# Patient Record
Sex: Female | Born: 1935 | Race: Black or African American | Hispanic: No | Marital: Single | Smoking: Former smoker
Health system: Southern US, Community
[De-identification: ages and names within clinical notes are randomized; demographics above are authoritative.]

## PROBLEM LIST (undated history)

## (undated) DIAGNOSIS — I10 Essential (primary) hypertension: Secondary | ICD-10-CM

## (undated) DIAGNOSIS — N289 Disorder of kidney and ureter, unspecified: Secondary | ICD-10-CM

---

## 1998-02-08 ENCOUNTER — Other Ambulatory Visit: Admission: RE | Admit: 1998-02-08 | Discharge: 1998-02-08 | Payer: Self-pay | Admitting: Obstetrics and Gynecology

## 1999-02-12 ENCOUNTER — Other Ambulatory Visit: Admission: RE | Admit: 1999-02-12 | Discharge: 1999-02-12 | Payer: Self-pay | Admitting: Obstetrics and Gynecology

## 1999-02-26 ENCOUNTER — Encounter: Payer: Self-pay | Admitting: Endocrinology

## 1999-02-26 ENCOUNTER — Ambulatory Visit (HOSPITAL_COMMUNITY): Admission: RE | Admit: 1999-02-26 | Discharge: 1999-02-26 | Payer: Self-pay | Admitting: Endocrinology

## 2000-03-03 ENCOUNTER — Ambulatory Visit (HOSPITAL_COMMUNITY): Admission: RE | Admit: 2000-03-03 | Discharge: 2000-03-03 | Payer: Self-pay | Admitting: Endocrinology

## 2000-03-03 ENCOUNTER — Encounter: Payer: Self-pay | Admitting: Endocrinology

## 2001-03-02 ENCOUNTER — Other Ambulatory Visit: Admission: RE | Admit: 2001-03-02 | Discharge: 2001-03-02 | Payer: Self-pay | Admitting: Obstetrics and Gynecology

## 2001-08-07 ENCOUNTER — Encounter (INDEPENDENT_AMBULATORY_CARE_PROVIDER_SITE_OTHER): Payer: Self-pay | Admitting: Specialist

## 2001-08-07 ENCOUNTER — Encounter: Payer: Self-pay | Admitting: Emergency Medicine

## 2001-08-08 ENCOUNTER — Encounter: Payer: Self-pay | Admitting: Emergency Medicine

## 2001-08-08 ENCOUNTER — Encounter: Payer: Self-pay | Admitting: Surgery

## 2001-08-08 ENCOUNTER — Inpatient Hospital Stay (HOSPITAL_COMMUNITY): Admission: EM | Admit: 2001-08-08 | Discharge: 2001-08-14 | Payer: Self-pay | Admitting: Emergency Medicine

## 2002-09-19 ENCOUNTER — Encounter (HOSPITAL_COMMUNITY): Admission: RE | Admit: 2002-09-19 | Discharge: 2002-12-18 | Payer: Self-pay | Admitting: Endocrinology

## 2002-10-05 ENCOUNTER — Ambulatory Visit (HOSPITAL_COMMUNITY): Admission: RE | Admit: 2002-10-05 | Discharge: 2002-10-05 | Payer: Self-pay | Admitting: *Deleted

## 2002-10-05 ENCOUNTER — Encounter (INDEPENDENT_AMBULATORY_CARE_PROVIDER_SITE_OTHER): Payer: Self-pay | Admitting: Specialist

## 2002-12-21 ENCOUNTER — Ambulatory Visit (HOSPITAL_COMMUNITY): Admission: RE | Admit: 2002-12-21 | Discharge: 2002-12-21 | Payer: Self-pay | Admitting: *Deleted

## 2003-03-28 ENCOUNTER — Other Ambulatory Visit: Admission: RE | Admit: 2003-03-28 | Discharge: 2003-03-28 | Payer: Self-pay | Admitting: Obstetrics and Gynecology

## 2005-04-18 ENCOUNTER — Other Ambulatory Visit: Admission: RE | Admit: 2005-04-18 | Discharge: 2005-04-18 | Payer: Self-pay | Admitting: Obstetrics and Gynecology

## 2007-08-13 ENCOUNTER — Emergency Department (HOSPITAL_COMMUNITY): Admission: EM | Admit: 2007-08-13 | Discharge: 2007-08-13 | Payer: Self-pay | Admitting: Emergency Medicine

## 2007-09-01 ENCOUNTER — Emergency Department (HOSPITAL_COMMUNITY): Admission: EM | Admit: 2007-09-01 | Discharge: 2007-09-01 | Payer: Self-pay | Admitting: Emergency Medicine

## 2009-01-21 ENCOUNTER — Emergency Department (HOSPITAL_COMMUNITY): Admission: EM | Admit: 2009-01-21 | Discharge: 2009-01-21 | Payer: Self-pay | Admitting: Emergency Medicine

## 2009-01-24 ENCOUNTER — Emergency Department (HOSPITAL_COMMUNITY): Admission: EM | Admit: 2009-01-24 | Discharge: 2009-01-24 | Payer: Self-pay | Admitting: Emergency Medicine

## 2009-01-25 ENCOUNTER — Inpatient Hospital Stay (HOSPITAL_COMMUNITY): Admission: EM | Admit: 2009-01-25 | Discharge: 2009-02-05 | Payer: Self-pay | Admitting: Internal Medicine

## 2009-01-26 ENCOUNTER — Ambulatory Visit: Payer: Self-pay | Admitting: Vascular Surgery

## 2009-01-26 ENCOUNTER — Encounter (INDEPENDENT_AMBULATORY_CARE_PROVIDER_SITE_OTHER): Payer: Self-pay | Admitting: Internal Medicine

## 2011-01-07 LAB — CBC
HCT: 27.2 % — ABNORMAL LOW (ref 36.0–46.0)
HCT: 27.8 % — ABNORMAL LOW (ref 36.0–46.0)
Hemoglobin: 10.9 g/dL — ABNORMAL LOW (ref 12.0–15.0)
Hemoglobin: 9.2 g/dL — ABNORMAL LOW (ref 12.0–15.0)
Hemoglobin: 9.5 g/dL — ABNORMAL LOW (ref 12.0–15.0)
MCHC: 33.7 g/dL (ref 30.0–36.0)
MCHC: 33.7 g/dL (ref 30.0–36.0)
MCHC: 34.1 g/dL (ref 30.0–36.0)
MCHC: 34.2 g/dL (ref 30.0–36.0)
MCV: 99.1 fL (ref 78.0–100.0)
Platelets: 184 10*3/uL (ref 150–400)
Platelets: 244 10*3/uL (ref 150–400)
RBC: 2.74 MIL/uL — ABNORMAL LOW (ref 3.87–5.11)
RBC: 2.78 MIL/uL — ABNORMAL LOW (ref 3.87–5.11)
RDW: 13.6 % (ref 11.5–15.5)
RDW: 13.7 % (ref 11.5–15.5)
RDW: 13.8 % (ref 11.5–15.5)
RDW: 13.9 % (ref 11.5–15.5)
RDW: 14.2 % (ref 11.5–15.5)
WBC: 10 10*3/uL (ref 4.0–10.5)
WBC: 9.6 10*3/uL (ref 4.0–10.5)

## 2011-01-07 LAB — HEMOCCULT GUIAC POC 1CARD (OFFICE)
Fecal Occult Bld: NEGATIVE
Fecal Occult Bld: NEGATIVE
Fecal Occult Bld: NEGATIVE

## 2011-01-07 LAB — COMPREHENSIVE METABOLIC PANEL
AST: 30 U/L (ref 0–37)
Albumin: 1.9 g/dL — ABNORMAL LOW (ref 3.5–5.2)
BUN: 16 mg/dL (ref 6–23)
CO2: 25 mEq/L (ref 19–32)
Calcium: 8 mg/dL — ABNORMAL LOW (ref 8.4–10.5)
Chloride: 112 mEq/L (ref 96–112)
Creatinine, Ser: 1.42 mg/dL — ABNORMAL HIGH (ref 0.4–1.2)
GFR calc Af Amer: 42 mL/min — ABNORMAL LOW (ref >60)
GFR calc non Af Amer: 35 mL/min — ABNORMAL LOW (ref >60)
Total Bilirubin: 0.6 mg/dL (ref 0.3–1.2)

## 2011-01-07 LAB — RETICULOCYTES
RBC.: 2.83 MIL/uL — ABNORMAL LOW (ref 3.87–5.11)
Retic Ct Pct: 1 % (ref 0.4–3.1)

## 2011-01-07 LAB — BASIC METABOLIC PANEL
BUN: 12 mg/dL (ref 6–23)
BUN: 24 mg/dL — ABNORMAL HIGH (ref 6–23)
CO2: 23 mEq/L (ref 19–32)
CO2: 23 mEq/L (ref 19–32)
CO2: 25 mEq/L (ref 19–32)
Calcium: 7.9 mg/dL — ABNORMAL LOW (ref 8.4–10.5)
Calcium: 8 mg/dL — ABNORMAL LOW (ref 8.4–10.5)
Calcium: 8.1 mg/dL — ABNORMAL LOW (ref 8.4–10.5)
Calcium: 8.5 mg/dL (ref 8.4–10.5)
Calcium: 8.5 mg/dL (ref 8.4–10.5)
Chloride: 109 mEq/L (ref 96–112)
Creatinine, Ser: 1.34 mg/dL — ABNORMAL HIGH (ref 0.4–1.2)
GFR calc Af Amer: 40 mL/min — ABNORMAL LOW (ref >60)
GFR calc Af Amer: 40 mL/min — ABNORMAL LOW (ref >60)
GFR calc Af Amer: 43 mL/min — ABNORMAL LOW (ref >60)
GFR calc Af Amer: 46 mL/min — ABNORMAL LOW (ref >60)
GFR calc non Af Amer: 33 mL/min — ABNORMAL LOW (ref >60)
GFR calc non Af Amer: 35 mL/min — ABNORMAL LOW (ref >60)
GFR calc non Af Amer: 36 mL/min — ABNORMAL LOW (ref >60)
GFR calc non Af Amer: 38 mL/min — ABNORMAL LOW (ref >60)
GFR calc non Af Amer: 38 mL/min — ABNORMAL LOW (ref >60)
Glucose, Bld: 113 mg/dL — ABNORMAL HIGH (ref 70–99)
Glucose, Bld: 97 mg/dL (ref 70–99)
Glucose, Bld: 97 mg/dL (ref 70–99)
Glucose, Bld: 98 mg/dL (ref 70–99)
Potassium: 3.4 mEq/L — ABNORMAL LOW (ref 3.5–5.1)
Potassium: 3.6 mEq/L (ref 3.5–5.1)
Potassium: 3.9 mEq/L (ref 3.5–5.1)
Potassium: 4 mEq/L (ref 3.5–5.1)
Potassium: 4.1 mEq/L (ref 3.5–5.1)
Sodium: 137 mEq/L (ref 135–145)
Sodium: 138 mEq/L (ref 135–145)
Sodium: 138 mEq/L (ref 135–145)
Sodium: 139 mEq/L (ref 135–145)
Sodium: 142 mEq/L (ref 135–145)

## 2011-01-07 LAB — PROTEIN, URINE, 24 HOUR
Collection Interval-UPROT: 24 hours
Protein, 24H Urine: 66 mg/d (ref 50–100)

## 2011-01-07 LAB — RHEUMATOID FACTOR: Rhuematoid fact SerPl-aCnc: <20 IU/mL (ref 0–20)

## 2011-01-07 LAB — HEPATIC FUNCTION PANEL
ALT: 34 U/L (ref 0–35)
ALT: 40 U/L — ABNORMAL HIGH (ref 0–35)
AST: 32 U/L (ref 0–37)
Bilirubin, Direct: 0.5 mg/dL — ABNORMAL HIGH (ref 0.0–0.3)
Indirect Bilirubin: 0.7 mg/dL (ref 0.3–0.9)
Total Bilirubin: 1.2 mg/dL (ref 0.3–1.2)
Total Protein: 5.4 g/dL — ABNORMAL LOW (ref 6.0–8.3)

## 2011-01-07 LAB — CK ISOENZYMES
CK-MB: 0 % (ref <5)
CK-MM: 100 % (ref 95–100)

## 2011-01-07 LAB — IRON AND TIBC
Iron: 13 ug/dL — ABNORMAL LOW (ref 42–135)
Saturation Ratios: 12 % — ABNORMAL LOW (ref 20–55)
Saturation Ratios: 23 % (ref 20–55)
TIBC: 113 ug/dL — ABNORMAL LOW (ref 250–470)

## 2011-01-07 LAB — PROTIME-INR
INR: 1.5 (ref 0.00–1.49)
Prothrombin Time: 19.1 seconds — ABNORMAL HIGH (ref 11.6–15.2)

## 2011-01-07 LAB — CK: Total CK: 55 U/L (ref 7–177)

## 2011-01-07 LAB — ANA: Anti Nuclear Antibody(ANA): NEGATIVE

## 2011-01-07 LAB — SYNOVIAL CELL COUNT + DIFF, W/ CRYSTALS
Eosinophils-Synovial: 0 % (ref 0–1)
Lymphocytes-Synovial Fld: 4 % (ref 0–20)
Monocyte-Macrophage-Synovial Fluid: 25 % — ABNORMAL LOW (ref 50–90)

## 2011-01-07 LAB — BRAIN NATRIURETIC PEPTIDE: Pro B Natriuretic peptide (BNP): 271 pg/mL — ABNORMAL HIGH (ref 0.0–100.0)

## 2011-01-07 LAB — SEDIMENTATION RATE: Sed Rate: 120 mm/hr — ABNORMAL HIGH (ref 0–22)

## 2011-01-07 LAB — FERRITIN: Ferritin: 727 ng/mL — ABNORMAL HIGH (ref 10–291)

## 2011-01-07 LAB — CK TOTAL AND CKMB (NOT AT ARMC)
CK, MB: 1.1 ng/mL (ref 0.3–4.0)
Total CK: 77 U/L (ref 7–177)

## 2011-01-07 LAB — FOLATE: Folate: 19.9 ng/mL

## 2011-01-08 LAB — CULTURE, BLOOD (ROUTINE X 2): Culture: NO GROWTH

## 2011-01-08 LAB — URINALYSIS, ROUTINE W REFLEX MICROSCOPIC
Hgb urine dipstick: NEGATIVE
Nitrite: NEGATIVE
Nitrite: NEGATIVE
Nitrite: NEGATIVE
Protein, ur: 30 mg/dL — AB
Protein, ur: 30 mg/dL — AB
Specific Gravity, Urine: 1.018 (ref 1.005–1.030)
Specific Gravity, Urine: 1.019 (ref 1.005–1.030)
Urobilinogen, UA: 1 mg/dL (ref 0.0–1.0)
Urobilinogen, UA: 1 mg/dL (ref 0.0–1.0)
Urobilinogen, UA: 2 mg/dL — ABNORMAL HIGH (ref 0.0–1.0)
pH: 6 (ref 5.0–8.0)

## 2011-01-08 LAB — URINE MICROSCOPIC-ADD ON

## 2011-01-08 LAB — DIFFERENTIAL
Basophils Absolute: 0 10*3/uL (ref 0.0–0.1)
Basophils Relative: 0 % (ref 0–1)
Basophils Relative: 1 % (ref 0–1)
Eosinophils Absolute: 0 10*3/uL (ref 0.0–0.7)
Eosinophils Absolute: 0.1 10*3/uL (ref 0.0–0.7)
Eosinophils Relative: 0 % (ref 0–5)
Eosinophils Relative: 1 % (ref 0–5)
Eosinophils Relative: 1 % (ref 0–5)
Lymphocytes Relative: 15 % (ref 12–46)
Lymphocytes Relative: 7 % — ABNORMAL LOW (ref 12–46)
Monocytes Absolute: 0.9 10*3/uL (ref 0.1–1.0)
Monocytes Absolute: 1.1 10*3/uL — ABNORMAL HIGH (ref 0.1–1.0)
Monocytes Relative: 10 % (ref 3–12)
Monocytes Relative: 11 % (ref 3–12)
Neutrophils Relative %: 72 % (ref 43–77)

## 2011-01-08 LAB — COMPREHENSIVE METABOLIC PANEL
ALT: 18 U/L (ref 0–35)
ALT: 54 U/L — ABNORMAL HIGH (ref 0–35)
ALT: 72 U/L — ABNORMAL HIGH (ref 0–35)
AST: 27 U/L (ref 0–37)
AST: 51 U/L — ABNORMAL HIGH (ref 0–37)
Albumin: 2.5 g/dL — ABNORMAL LOW (ref 3.5–5.2)
Albumin: 3.6 g/dL (ref 3.5–5.2)
Alkaline Phosphatase: 78 U/L (ref 39–117)
Alkaline Phosphatase: 84 U/L (ref 39–117)
BUN: 20 mg/dL (ref 6–23)
CO2: 26 mEq/L (ref 19–32)
CO2: 27 mEq/L (ref 19–32)
Calcium: 8.3 mg/dL — ABNORMAL LOW (ref 8.4–10.5)
Calcium: 8.9 mg/dL (ref 8.4–10.5)
Chloride: 110 mEq/L (ref 96–112)
Chloride: 98 mEq/L (ref 96–112)
Creatinine, Ser: 1.46 mg/dL — ABNORMAL HIGH (ref 0.4–1.2)
Creatinine, Ser: 1.57 mg/dL — ABNORMAL HIGH (ref 0.4–1.2)
Creatinine, Ser: 1.58 mg/dL — ABNORMAL HIGH (ref 0.4–1.2)
GFR calc Af Amer: 38 mL/min — ABNORMAL LOW (ref >60)
GFR calc Af Amer: 41 mL/min — ABNORMAL LOW (ref >60)
GFR calc non Af Amer: 31 mL/min — ABNORMAL LOW (ref >60)
GFR calc non Af Amer: 31 mL/min — ABNORMAL LOW (ref >60)
GFR calc non Af Amer: 34 mL/min — ABNORMAL LOW (ref >60)
Glucose, Bld: 113 mg/dL — ABNORMAL HIGH (ref 70–99)
Glucose, Bld: 97 mg/dL (ref 70–99)
Potassium: 3.1 mEq/L — ABNORMAL LOW (ref 3.5–5.1)
Potassium: 3.6 mEq/L (ref 3.5–5.1)
Sodium: 136 mEq/L (ref 135–145)
Sodium: 138 mEq/L (ref 135–145)
Sodium: 142 mEq/L (ref 135–145)
Total Bilirubin: 1.4 mg/dL — ABNORMAL HIGH (ref 0.3–1.2)
Total Bilirubin: 2.2 mg/dL — ABNORMAL HIGH (ref 0.3–1.2)
Total Protein: 5.9 g/dL — ABNORMAL LOW (ref 6.0–8.3)
Total Protein: 7.2 g/dL (ref 6.0–8.3)
Total Protein: 7.7 g/dL (ref 6.0–8.3)

## 2011-01-08 LAB — ANA: Anti Nuclear Antibody(ANA): NEGATIVE

## 2011-01-08 LAB — CBC
HCT: 30.3 % — ABNORMAL LOW (ref 36.0–46.0)
HCT: 37.7 % (ref 36.0–46.0)
Hemoglobin: 10.2 g/dL — ABNORMAL LOW (ref 12.0–15.0)
Hemoglobin: 12.3 g/dL (ref 12.0–15.0)
Hemoglobin: 13.7 g/dL (ref 12.0–15.0)
MCHC: 33.6 g/dL (ref 30.0–36.0)
MCHC: 35.3 g/dL (ref 30.0–36.0)
MCV: 99.2 fL (ref 78.0–100.0)
MCV: 99.3 fL (ref 78.0–100.0)
MCV: 99.6 fL (ref 78.0–100.0)
Platelets: 161 10*3/uL (ref 150–400)
Platelets: 177 10*3/uL (ref 150–400)
RBC: 3.04 MIL/uL — ABNORMAL LOW (ref 3.87–5.11)
RBC: 3.8 MIL/uL — ABNORMAL LOW (ref 3.87–5.11)
RDW: 12.5 % (ref 11.5–15.5)
RDW: 13.4 % (ref 11.5–15.5)
RDW: 13.7 % (ref 11.5–15.5)
WBC: 11.5 10*3/uL — ABNORMAL HIGH (ref 4.0–10.5)
WBC: 7.2 10*3/uL (ref 4.0–10.5)
WBC: 9.2 10*3/uL (ref 4.0–10.5)

## 2011-01-08 LAB — POCT I-STAT, CHEM 8
BUN: 26 mg/dL — ABNORMAL HIGH (ref 6–23)
HCT: 38 % (ref 36.0–46.0)
Hemoglobin: 12.9 g/dL (ref 12.0–15.0)
Sodium: 138 mEq/L (ref 135–145)
TCO2: 25 mmol/L (ref 0–100)

## 2011-01-08 LAB — CARDIAC PANEL(CRET KIN+CKTOT+MB+TROPI)
CK, MB: 2.3 ng/mL (ref 0.3–4.0)
CK, MB: 3.4 ng/mL (ref 0.3–4.0)
CK, MB: 4.5 ng/mL — ABNORMAL HIGH (ref 0.3–4.0)
Relative Index: 0.4 (ref 0.0–2.5)
Relative Index: 0.5 (ref 0.0–2.5)
Relative Index: 0.6 (ref 0.0–2.5)
Total CK: 540 U/L — ABNORMAL HIGH (ref 7–177)
Total CK: 645 U/L — ABNORMAL HIGH (ref 7–177)
Total CK: 768 U/L — ABNORMAL HIGH (ref 7–177)
Troponin I: 0.02 ng/mL (ref 0.00–0.06)
Troponin I: 0.02 ng/mL (ref 0.00–0.06)
Troponin I: 0.03 ng/mL (ref 0.00–0.06)

## 2011-01-08 LAB — ACETAMINOPHEN LEVEL: Acetaminophen (Tylenol), Serum: <10 ug/mL — ABNORMAL LOW (ref 10–30)

## 2011-01-08 LAB — CERULOPLASMIN: Ceruloplasmin: 34 mg/dL (ref 21–63)

## 2011-01-08 LAB — TSH: TSH: 3.363 u[IU]/mL (ref 0.350–4.500)

## 2011-01-08 LAB — PROTIME-INR
INR: 1.3 (ref 0.00–1.49)
INR: 1.4 (ref 0.00–1.49)
Prothrombin Time: 17 seconds — ABNORMAL HIGH (ref 11.6–15.2)
Prothrombin Time: 17.3 seconds — ABNORMAL HIGH (ref 11.6–15.2)

## 2011-01-08 LAB — LIPASE, BLOOD: Lipase: 23 U/L (ref 11–59)

## 2011-01-08 LAB — APTT
aPTT: 41 seconds — ABNORMAL HIGH (ref 24–37)
aPTT: 42 seconds — ABNORMAL HIGH (ref 24–37)

## 2011-01-08 LAB — URINE CULTURE
Colony Count: NO GROWTH
Culture: NO GROWTH

## 2011-01-08 LAB — AMMONIA: Ammonia: 23 umol/L (ref 11–35)

## 2011-01-08 LAB — AMYLASE: Amylase: 72 U/L (ref 27–131)

## 2011-01-08 LAB — BRAIN NATRIURETIC PEPTIDE: Pro B Natriuretic peptide (BNP): 59.3 pg/mL (ref 0.0–100.0)

## 2011-01-08 LAB — LACTIC ACID, PLASMA: Lactic Acid, Venous: 1.4 mmol/L (ref 0.5–2.2)

## 2011-01-08 LAB — HEPATITIS B CORE ANTIBODY, IGM: Hep B C IgM: NEGATIVE

## 2011-01-08 LAB — ANTI-SMOOTH MUSCLE ANTIBODY, IGG: F-Actin IgG: <20 U (ref <20)

## 2011-01-08 LAB — HEPATITIS B SURFACE ANTIGEN: Hepatitis B Surface Ag: NEGATIVE

## 2011-01-08 LAB — HEPATITIS A ANTIBODY, IGM: Hep A IgM: NEGATIVE

## 2011-01-08 LAB — HEPATITIS E ANTIBODY, IGG: Hepatitis E IgG Abs: NONREACTIVE

## 2011-02-11 NOTE — Consult Note (Signed)
NAME:  Victoria [name], Victoria [name]              ACCOUNT NO.:  [id number]   MEDICAL RECORD NO.:  [id number]          PATIENT TYPE:  INP   LOCATION:  1432                         FACILITY:  [facility]   PHYSICIAN:  Aundra Dubin, M.D.DATE OF BIRTH:  [birth year]   DATE OF CONSULTATION:  01/29/2009  DATE OF DISCHARGE:                                 CONSULTATION   REFERRING PHYSICIAN:  Eduard Clos, MD.   CHIEF COMPLAINT:  Myositis.   Victoria [name] is an 77 year old black female with history of hypertension  and hyperlipidemia who was admitted on January 24, 2009, primarily because  of abdominal pain.  She has also had been having right knee pain that  was quite acute for 1-2 days prior to this.  Her grandson with whom she  lives was not able to get her up from a toilet and she just become weak.  She was having great deal of abdominal pain on admission, this is now  resolved.  She is also not having any groin related pain.  She underwent  a CT scan of the abdomen with no acute findings.  She also has had a  recent MRI of the lower abdomen and hip area.  This was showing edema  into the muscle areas and I have reviewed this with the radiologist.  There was also a small effusion in the hip joints, which is almost  unremarkable.  Early on this admission, she had increased CK at 768 and  this is reduced to 540.  Also, she has some slightly elevated LFTs but  they are now corrected and completely normal.  She is not feeling weak  in her upper extremities.  She says that she cannot move the left leg  very much in part because of his knee pain.  The right knee is worse and  it hurts great deal.  She is also hurting at the right heel.  When she  was admitted, hemoglobin was 12.9, and now is 9.8.  She has high  ferritin level.  On admission, her creatinine was 1.7 and this is now  1.34.   There has been no recent weight loss.  She has been having some fever  while here in the hospital to 101  occasionally, there have been other  temperature at 99.5.  She has had no rashes, headaches, acute vision  changes, jaw pain, diarrhea.  Some of the abdominal studies did show  some issues of constipation.   MEDICINES:  Cipro, Vasotec 2.5 mg daily, heparin, Protonix 40 mg daily,  Flagyl 500 mg q.8 h., normal saline, magnesium, oxycodone p.r.n.   FAMILY HISTORY:  Her mother died in her 56s with CVA.  She did not know  her father's health status well.   SOCIAL HISTORY:  She is [place] native.  She lives with grandson.  She smoked some in the distant past.  She does not drink alcohol.   PHYSICAL EXAMINATION:  VITAL SIGNS:  Weight 66.7 kilograms, temperature  99.7, pulse 78, blood pressure 116/55, respirations 14.  GENERAL:  She appears comfortable in the bed and gives a good history.  HEENT:  Slightly muddy sclerae.  EOMI.  Mouth is clear.  NECK:  No  JVD.  LUNGS:  Clear.  HEART:  Regular.  ABDOMEN:  Mildly tender with no discrete masses.  MUSCULOSKELETAL:  The hands, wrist, elbows, shoulders have good range of  motion and have stiffness to the shoulders.  She is able to place her  behind her shoulders.  The right knee is warm, has not effusion and will  not flex much at all because of the pain.  At the right heel, there is a  formed ulcer that is not opened up.  The left knee is mildly warm but  does not flex well at all.  NEURO:  Deltoid strength 5/5.   PROCEDURE:  Right knee aspiration and injection.  Skin was marked and  cleaned with Betadine and alcohol swabs and cooled with ethyl chloride.  Approximately 25 mL of clear yellow fluid was removed.  A 60 mg of Depo-  Medrol and 1 mL of lidocaine was injected.   ASSESSMENT AND PLAN:  1. Elevated creatine kinase, although I will check these labs again      along with an LDH, AST, ALT.  Strength in the upper extremities is      5/5.  I believe this is sign of myositis found on the MRI are      nonspecific, seen more like this is  edema.  She has great deal of      edema to the lower extremities, not mentioned in the physical      examination.  2. Osteoarthritis of the knees.  I reviewed the knee x-rays over the      last month, and they showed quite severe bone-on-bone joint loss.  3. Hyperlipidemia.  4. Hypertension.  5. History of osteoporosis.  6. I believe the physical therapy should come and help her with      becoming more mobile.           ______________________________  Aundra Dubin, M.D.     WWT/MEDQ  D:  01/29/2009  T:  01/30/2009  Job:  [id number]

## 2011-02-11 NOTE — H&P (Signed)
NAME:  Victoria [name], Victoria [name]              ACCOUNT NO.:  [id number]   MEDICAL RECORD NO.:  [id number]          PATIENT TYPE:  INP   LOCATION:  1432                         FACILITY:  [facility] [place]   PHYSICIAN:  Sarajane Marek, MD     DATE OF BIRTH:  1920-10-05   DATE OF ADMISSION:  01/25/2009  DATE OF DISCHARGE:                              HISTORY & PHYSICAL   ADMISSION DIAGNOSIS:  Abdominal pain.   HISTORY OF PRESENT ILLNESS:  Victoria [name] is an 77 year old female with  past medical history significant for hypertension who presents with 5  days of increasing lower and right-sided abdominal pain.  She reports  that the pain is primarily with movement, has no association with food,  and does not radiate.  She has not had nausea, vomiting, or diarrhea.  She has no change in her bowel habits and has not noticed any blood in  her stool.  She was seen in the emergency department several times over  the last week.  At the first admission was found to have a urinary tract  infection and was started on Macrodantin 5 days ago.  She was seen again  yesterday early in the morning with complaint of lower extremity edema  and was put on Lasix, however, she did not ever initiate this  medication.  She is typically independent and is able to ambulate and  drive on her but this week has been unable to care for herself.  She  denies fevers, chills, or sweats.  She has not had any recent viral  syndrome.  She does not have shortness of breath, orthopnea, or chest  pain.  Evaluation to this point includes a noncontrasted CT scan, which  was unremarkable in terms of acute findings.  She does have a right  renal cyst.  Laboratory data was remarkable yesterday for an elevated  total bilirubin of 2.2 with an elevated alkaline phosphatase.  She is  unsure if she has had a cholecystectomy, though past medical history per  the electronic medical record indicates that this is the case.   PAST MEDICAL HISTORY:  1.  Hypertension.  2. Hyperlipidemia.  3. Arthritis and osteoporosis.  4. Status post appendectomy secondary to perforation and peritonitis.  5. Possible history of cholecystectomy.   ALLERGIES:  PENICILLIN, which causes rash.   MEDICATIONS:  1. Actonel once a month.  2. Vaseretic, dose is unknown, every other day.  3. Pravachol, dose is unknown, daily.  4. Macrodantin 100 mg b.i.d. started January 21, 2009.   SOCIAL HISTORY:  The patient has a remote history of smoking.  She has  occasional alcohol use but has a history of heavy alcohol use in her  20's and 30's.  She currently lives with her grandsons and is able to  perform all activities of daily living and typically ambulates as well  as drives on her own.   FAMILY HISTORY:  Her mother was deceased at age 20 with a CVA.  She has  no siblings.  She did not know her father.   REVIEW OF SYSTEMS:  Complete 14-point review of systems was  performed  and was negative except as noted in HPI.   PHYSICAL EXAMINATION:  VITAL SIGNS:  Temperature is 98.8, blood pressure  155/68, pulse 78, respiratory rate 20, and oxygen saturation is 100% on  room air.  GENERAL:  This is a very pleasant and well-appearing elderly African-  American female in no acute distress.  EYES:  Extraocular muscles are intact and sclerae are clear.  ENT: Oropharynx is clear.  Mucous membranes are tacky.  NECK:  There is elevation of jugular venous pulse.  There is no carotid  bruit.  No thyromegaly.  No lymphadenopathy.  CARDIOVASCULAR:  Heart rate is regular with normal S1 and S2.  She has a  3/6 systolic ejection murmur at the right sternal border.  No gallop or  rub.  RESPIRATORY:  There is decreased breath sounds at the right base,  otherwise clear to auscultation.  I was not able to percuss the right  lung field secondary to the patient's inability to sit up with abdominal  pain.  ABDOMEN:  Soft with right upper quadrant tenderness to palpation and  enlarged  liver palpable 2 to 3 cm below the costal margin.  She has a  well-healed midline surgical scar.  Bowel sounds are normal x4  quadrants.  There is no rebound or guarding.  NEURO:  The patient is alert and oriented x3 with no focal deficits.  EXTREMITIES:  She has 2+ pulses x4 extremities.  She does have lower  extremity edema, right greater than left, right being 1+ and left being  trace.   EKG with sinus rhythm, leftward axis, nonspecific T-wave abnormality,  which is unchanged from prior.   CT scan of the abdomen and pelvis from January 24, 2009, with no acute  findings in the pelvis, complex cystic lesion in the upper pole of the  right kidney was seen, likely a cyst complicated by proteinaceous debris  or hemorrhage, though neoplasm could not be ruled out.   LABORATORY DATA:  Laboratory data from January 24, 2009:  Urinalysis with  7 to 10 white blood cells, 3 to 6 red blood cells, and many bacteria.  CMP with a sodium of 134, potassium of 3.1, chloride of 98, bicarb of  27, glucose of 161, BUN 24, creatinine 1.6, total bilirubin is 2.2 up  from 0.4 on January 21, 2009, alkaline phosphatase 71, AST 27, ALT 20,  total protein 7.4, albumin 3.3, and calcium 8.9.  CBC on January 24, 2009,  with white blood cell count 11.5, hemoglobin 12.6, hematocrit 37.7, and  platelets 169,000.   ASSESSMENT/PLAN:  Victoria [name] is an 77 year old African-American female  with history of hypertension and hyperlipidemia presenting with 5 days  of abdominal pain, elevated white blood cell count, and now elevated  bilirubin with obstructive pattern and elevated alkaline phosphatase.  1. Abdominal pain:  Given the patient's age and clinical and      laboratory findings, I am concerned for cholangitis.  We will go      ahead and initiate antibiotics after drawing blood cultures x2 with      Cipro 400 intravenous b.i.d. and Flagyl 500 intravenous q.8h.  We      will send amylase, lipase, ANA, ASMA, ammonia,  acetaminophen level,      ABG with lactate, ceruloplasmin, anti-hepatitis A IgM, Hep B      surface antigen, anti-Hep B core IgM, and anti-HEV.  We will also      check TSH and BNP, obtain chest x-ray and  echocardiogram in      consideration that this may be secondary to a cardiac process with      hepatic congestion and right-sided failure.  As such, we will      follow serial cardiac biomarkers x3 sets to rule out cardiac      process.  We will also check PT, PTT, and INR for synthetic      dysfunction of the liver.  The pattern of liver function tests      suggested an obstructive process and as such we will obtain a right      upper quadrant ultrasound.  I doubt this is hepatocyte necrosis      given normal transaminases.  We will also check stool Hemoccult's      to evaluate for occult blood.  2. Lower extremity edema:  As noted above, we will get an      echocardiogram and evaluate for congestive heart failure.  We will      also obtain bilateral lower extremity Dopplers to evaluate for deep      vein thrombosis as right lower extremity edema is greater than      left.  3. Hypertension:  At home, she is on Vaseretic.  We will hold the HCTZ      portion given renal insufficiency.  We will continue enalapril 5 mg      daily.  Family is to bring in medications with doses tomorrow.  4. Hyperlipidemia:  We will hold Pravachol given abnormal liver      function.  5. Renal insufficiency:  Chronicity is unknown.  We will initiate      intravenous fluids with normal saline overnight and repeat      electrolytes in the morning.  6. Fluid, electrolytes, and nutrition:  The patient does have very      mild hyponatremia as well as hypokalemia on CMP from yesterday.      Will repeat CMP tonight and replete potassium if indicated.  Her      hyponatremia may be secondary to liver dysfunction and      hypervolemia.  We will repeat CMP in the morning.  7. Prophylaxis.  We will start Protonix 40 mg  daily as well as      subcutaneous heparin.  8. Disposition:  We will consult PT and OT given the patient's      inability to ambulate at this time.  She is full code.      Sarajane Marek, MD  Electronically Signed     HH/MEDQ  D:  01/25/2009  T:  01/26/2009  Job:  [id number]

## 2011-02-11 NOTE — Discharge Summary (Signed)
NAME:  Victoria [name], CUE NO.:  [id number]   MEDICAL RECORD NO.:  [id number]          PATIENT TYPE:  INP   LOCATION:  1432                         FACILITY:  [facility]   PHYSICIAN:  Isidor Holts, M.D.  DATE OF BIRTH:  Oct 31, 1920   DATE OF ADMISSION:  01/25/2009  DATE OF DISCHARGE:  02/05/2009                               DISCHARGE SUMMARY   ADDENDUM   PRIMARY MEDICAL DOCTOR:  Bernadene Person, MD, [facility].   For discharge diagnoses, refer to interim summary dictated Jan 29, 2009,  by Dr. Midge Minium.  Refer also to above-mentioned summary, for  details of admission history, procedures, consultations, and clinical  course.  For the period however, from Jan 30, 2009, to Feb 04, 2009, the  following are pertinent:  The patient was seen in consultation by Dr. Kellie Simmering, Rheumatologist.  For details of that consultation, refer to consultation notes of Jan 29, 2009.  He performed an aspiration of patient's left knee on that date,  and this showed hazy-appearing synovial fluid, WBC 525, neutrophils 71%,  lymphocytes 4%, monocyte macrophage 25%.  There were no eosinophils.  Crystal exam showed no evidence of crystals.  The patient was managed  with simple analgesics for osteoarthritis, and underwent physiotherapy  with satisfactory clinical response.  Myositis/rhabdomyolysis resolved  and, as a matter of fact, total creatine-kinase was normal at 44 on Jan 30, 2006. Although she does have a mild iron-deficiency anemia as  evidenced by iron studies showing an iron of 26, TIBC 115, percentage  saturation 23, fecal occult blood testing was negative.  MCV was normal  at 99.7.  Vitamin B12 was 367.  Serum folate was 19.9.  Ferritin was  727, so this anemia may be nutritional.  The patient's hemoglobin was  stable/reasonable at 10.9 on Feb 03, 2009, with the hematocrit of 32.3.  Her bilateral lower extremity edema responded to diuretic therapy, as  well as application of TED stockings.  The patient's albumin was found  to be significantly low at 1.9 to 2.1.  Likely edema was  hypoalbuminemic.  Nutritional consultation was sought during the course  of this hospitalization.  No nutritional supplements have been advised,  although balanced diet has been recommended.  The patient did undergo a  24-hour urine collection to evaluate for possible nephrotic syndrome,  but, 24-hour urine was normal at 66 mg/day.  BP was controlled with ACE  inhibitor and treatment for dyslipidemia has been instituted with Zetia  as understandably, statin was discontinued secondary to  rhabdomyolysis/myositis.  The patient was evaluated by PT/OT during this  hospitalization and has recommended rehabilitation in the short-term  skilled nursing facility environment.   DISPOSITION:  The patient was on Feb 04, 2009, asymptomatic, ambulating  with a walker, felt considerably better, and was therefore considered  clinically stable for discharge on Feb 05, 2009.   DISCHARGE MEDICATIONS:  1. Protonix 40 mg p.o. daily.  2. Senokot-S 1 tablet p.o. q.h.s.  3. Enalapril 2.5 mg p.o. daily.  4. Lasix 40 mg p.o. daily for a week, and then review by nursing  facility MD is recommended.  5. Zetia 10 mg p.o. daily.  6. Darvocet N-100 one p.o. p.r.n. t.i.d.  7. Actonel 150 mg p.o. monthly.  8. Calcium plus vitamin D 500 mg p.o. b.i.d.   NOTE:  Vasoretic, Triamterene/Hydrochlorothiazide, Pravastatin, and  Macrodantin, have all been discontinued.   DIET:  Heart-healthy.   ACTIVITY:  As tolerated, recommended to increase activity slowly,  otherwise per PT/OT.   FOLLOWUP INSTRUCTIONS:  The patient is to follow up with the nursing  facility MD, and following discharge from skilled nursing facility  follow up with her primary MD, Dr. Adela Lank.      Isidor Holts, M.D.  Electronically Signed     CO/MEDQ  D:  02/04/2009  T:  02/04/2009  Job:  [id number]    cc:   Brooke Bonito, M.D.  Fax: [phone]

## 2011-02-11 NOTE — Discharge Summary (Signed)
NAME:  Victoria [name], Victoria [name]              ACCOUNT NO.:  [id number]   MEDICAL RECORD NO.:  [id number]          PATIENT TYPE:  INP   LOCATION:  1432                         [facility]   PHYSICIAN:  Eduard Clos, MDDATE OF BIRTH:  [birth year]   DATE OF ADMISSION:  01/25/2009  DATE OF DISCHARGE:                               DISCHARGE SUMMARY   INTERIM DISCHARGE SUMMARY:   COURSE IN THE HOSPITAL:  [age]-year-old female with known history  of hypertension, hyperlipidemia, who presented with complaints of  abdominal pain.  Abdominal pain was lower abdomen with constant pain.  Patient was initially treated for a UTI a day or two in the ER, was  found to have persistent pain along with some lower extremity edema.  Initial CT abdomen and pelvis done did not show any acute features.  Patient was found to have mildly elevated LFTs with AST/ALT measuring  around 90 and 72, alkaline phosphatase was 97, which gradually decreased  over the admission time.  CPK on admission was 768, decreased to 540  with hydration.  Patient also had an increased creatinine of 1.5, which  at this time is 1.2.  On exam patient also was found to have bilateral  hip pain intense on even minimal movements.  The patient's sonogram of  the abdomen done did not show any liver pathology or any acute features.  MRI of the hip was done subsequently because of the intense bilateral  hip pain, which showed diffuse subcutaneous edema within the lower  abdomen and within the upper thighs bilaterally.  Heterogeneous  intramuscular edema is attributed to myositis, which is nonspecific.  Bilateral joint effusion of the hips, no abnormal osseous signal.  At  this time I have consulted Dr. Kellie Simmering of Rheumatology who is coming to  see the patient.  We will follow their recommendation.  At this time we  have stopped patient's statin at the time of admission and also is on  empiric antibiotics.  Blood cultures done so far have  shown no growth.  Patient did have mild fever on admission.  Urine cultures also are  showing no growth.   PROCEDURES DONE DURING THE STAY:  1. CT abdomen and pelvis on January 24, 2009 showed no acute findings,      complex cystic lesion in upper pole of the right kidney.  This is      most likely a cyst complicated by proteinaceous  debris or      hemorrhage, although cystic neoplasm cannot be definitely excluded.      If further evaluation is indicated this patient consider followup      CT in 3 months to determine how this lesion is progressing.  No      acute findings in the pelvis.  2. Sonogram of the abdomen showed bilateral cysts in the renal and no      acute findings.  Had a prior cholecystectomy.  3. Chest x-ray on January 26, 2009 showed 2.3 cm right apical lesion.      This could be an aneurysm or vascular ectasia.  CT chest with  contrast is suggested to rule out emasculation.  Marked elevation      of the right hemidiaphragm with right lower lobe atelectasis.  4. Bilateral hip with pelvis showed bilateral degenerative changes in      the hips.  5. CT chest without contrast on Jan 27, 2009 showed tortuous proximal      right subclavian artery __________ density seen.  6. Chest radiograph.  Small right pleural effusion and adjacent right      lower lobe atelectasis.  7. Chest x-ray on Jan 28, 2009 showed decreased lung volumes with      increased right basilar atelectasis in the position marked.  Right      hemidiaphragm elevation, cardiomegaly.  Rule out congestive heart      failure.  8. MRI of the hip bilateral on Jan 28, 2009 shows diffuse subcutaneous      edema within the lower abdomen and within the upper thighs      bilaterally.  Heterogeneous intramuscular edema is attributed to      myositis, which is nonspecific.  Bilateral joint effusion in hips.      No abnormal osseous signal.   PERTINENT LABORATORIES:  On admission, patient had an elevated CPK at  768, now  it is 540.  Creatinine was 1.5, now it is 1.2.  Patient also  has normocytic normochromic anemia.   FINAL DIAGNOSES:  1. Diffuse subcutaneous edema within the lower abdomen and thighs      attributed to myositis.  2. Bilateral joint effusion of the hips.  3. Acute renal failure on chronic kidney disease.  4. Mild rhabdomyolysis secondary to myositis.  5. Hypertension.  6. Hyperlipidemia.  7. Normocytic normochromic anemia.  8. Renal cyst.   PLAN:  At this time I have consulted Dr. Kellie Simmering of Rheumatology.  We  will follow his recommendations.  Patient will need a definite repeat CT  abdomen and pelvis for reevaluating renal cyst at least within the next  3 months.  At this time patient's statin has been held because of mild  rhabdomyolysis.  Patient also did receive some hydration.  At this time  hydration is stopped because patient developed some shortness of breath  and we will give some Lasix.  We will follow Dr. Ines Bloomer  recommendation.  Further recommendations will be based on patient's  condition as it evolves.      Eduard Clos, MD  Electronically Signed     ANK/MEDQ  D:  01/29/2009  T:  01/29/2009  Job:  [id number]

## 2011-02-14 NOTE — Op Note (Signed)
   NAME:  Victoria [name], Victoria [name]                        ACCOUNT NO.:  [id number]   MEDICAL RECORD NO.:  [id number]                   PATIENT TYPE:  AMB   LOCATION:  ENDO                                 FACILITY:  [facility]   PHYSICIAN:  Georgiana Spinner, M.D.                 DATE OF BIRTH:  1920-10-29   DATE OF PROCEDURE:  10/05/2002  DATE OF DISCHARGE:                                 OPERATIVE REPORT   PROCEDURE PERFORMED:  Upper endoscopy.   ENDOSCOPIST:  Georgiana Spinner, M.D.   INDICATIONS FOR PROCEDURE:  Hemoccult positivity.   ANESTHESIA:  Demerol 50 mg, Versed 3 mg.   DESCRIPTION OF PROCEDURE:  With the patient mildly sedated in the left  lateral decubitus position, the Olympus video endoscope was inserted in the  mouth and passed under direct vision through the esophagus which appeared  normal into the stomach.  The fundus showed changes of Helicobacter pylori  presumably which were photographed and biopsied in the body of the stomach  as there were changes as well and an ulcer was seen on the posterior wall  greater curvature, which was photographed and biopsied.  We entered into the  duodenal bulb and the antrum, which appeared normal.  The duodenal bulb and  second portion of the duodenum appeared normal.  From this point, the  endoscope was slowly withdrawn taking circumferential views of the entire  duodenal mucosa until the endoscope was pulled back into the stomach and  placed on retroflexion to view the stomach from below.  The endoscope was  then straightened and withdrawn taking circumferential views of the  remaining gastric and esophageal mucosa.  The patient's vital signs and  pulse oximeter remained stable.  The patient tolerated the procedure well  without apparent complications.   FINDINGS:  Changes of what appears to be Helicobacter pylori gastritis with  an ulcer on the posterior wall.  Await biopsy report.  The patient will call  me for results and follow up with me as  an outpatient.  The ulcer itself was  flat based with a white base.  At this time will start her on Aciphex  pending results of biopsy, hold aspirin and treat for Helicobacter pylori if  present.  She will call me for result of the biopsy in 48 hours.   PLAN:                                               Georgiana Spinner, M.D.    GMO/MEDQ  D:  10/05/2002  T:  10/05/2002  Job:  [id number]

## 2011-02-14 NOTE — Op Note (Signed)
   NAME:  Victoria [name], Victoria [name]                        ACCOUNT NO.:  [id number]   MEDICAL RECORD NO.:  [id number]                   PATIENT TYPE:  AMB   LOCATION:  ENDO                                 FACILITY:  [facility]   PHYSICIAN:  Georgiana Spinner, M.D.                 DATE OF BIRTH:  09-18-1921   DATE OF PROCEDURE:  12/21/2002  DATE OF DISCHARGE:                                 OPERATIVE REPORT   PROCEDURE:  Upper endoscopy.   INDICATIONS:  Hemoccult positivity.  Follow-up of gastric ulcer treated for  H. pylori.   ANESTHESIA:  1. Demerol 30 mg.  2. Versed 3 mg.   DESCRIPTION OF PROCEDURE:  With patient mildly sedated in the left lateral  decubitus position, the Olympus videoscopic endoscope inserted in the mouth,  passed under direct vision through the esophagus, which appeared normal,  into the stomach.  Fundus, body, antrum, duodenal bulb, and second portion  of duodenum all appeared normal.  From this point, the endoscope was slowly  withdrawn, taking circumferential views of the duodenal mucosa until the  endoscope then pulled back into the stomach, placed in retroflexion to view  the stomach from below.  The endoscope was then straightened and withdrawn,  taking circumferential views of the remaining gastric and esophageal mucosa.  The patient's vital signs and pulse oximeter remained stable.  The patient  tolerated the procedure well without apparent complications.   FINDINGS:  This is an essentially normal examination with healing of  previously noted gastric ulcer.   PLAN:  Have patient follow up with me as needed.                                               Georgiana Spinner, M.D.    GMO/MEDQ  D:  12/21/2002  T:  12/21/2002  Job:  [id number]

## 2011-02-14 NOTE — Op Note (Signed)
[facility]  Patient:    Victoria [name], Victoria [name] Visit Number: [id number] MRN: [id number]          Service Type: MED Location: 2S [phone] Attending Physician:  Meredith Leeds Dictated by:   Zigmund Daniel, M.D. Proc. Date: 08/08/01 Admit Date:  08/07/2001   CC:         Bernadene Person, M.D.                           Operative Report  PREOPERATIVE DIAGNOSIS:   Acute appendicitis.  POSTOPERATIVE DIAGNOSIS:  Acute appendicitis with perforation and mild peritonitis but no abscess.  OPERATION:  Laparoscopy and open appendectomy.  SURGEON:  Zigmund Daniel, M.D.  ANESTHESIA:  General.  DESCRIPTION OF PROCEDURE:  After the patient was monitored, anesthetized and had a Foley catheter inserted and had routine preparation and draping of the abdomen, I made a short midline incision just above the umbilicus, dissected down to the fascia, opened it longitudinally and bluntly entered the peritoneum carefully.  There was a small amount of cloudy fluid present. There was a small amount of cloudy fluid present.  I put in a pursestring suture of 2-0 Vicryl and secured the Hasson cannula, inflated the abdomen of C02 and put in a camera.  There was some evidence of inflammation in the right side of the abdomen but there were rather extensive adhesions of omentum to the anterior abdominal wall and I could not locate the appendix despite putting in a 5 mm right upper quadrant port and moving the bowel about a good bit.  I abandoned the laparoscopic technique and extended the midline incision inferiorly and got into the abdomen.  I took down the adhesions and located an acutely inflamed appendix which was long and healthy at the base but inflamed and necrotic with a tiny ruptured area near the tip.  I clamped the mesoappendix, divided it and ligated it with 2-0 Vicryl to ligate the appendix and then excluded the appendiceal stump with a couple of 2-0 Vicryl  stitches in the seromuscular layers of the bowel and in the mesenteric fat.  One small bleeding area of the mesoappendix was sutured with 2-0 Vicryl, and hemostasis was good.  I then copiously irrigated the abdominal cavity with three liters of saline solution until it was clear.  I found no evidence of any abscesses. There was a right inguinal hernia which reduced easily by pulling in some omentum.  There was no evidence of any infection in that area or any infarction of any fat.  I was careful not to leave any sponges in the abdomen but at the time of closure a sponge count had not been accomplished because of the hurried conversion of the case to open procedure.  An x-ray is pending to prove that there are no sponges or instruments left in the abdomen.  I closed the fascia with #1 running PDS and closed the skin loosely with staples with some saline soaked gauze packed in the interstices.  She tolerated the operation well. Dictated by:   Zigmund Daniel, M.D. Attending Physician:  Meredith Leeds DD:  08/08/01 TD:  08/09/01 Job: [id number]

## 2011-02-14 NOTE — Discharge Summary (Signed)
[facility]  Patient:    Victoria [name], Victoria [name] Visit Number: [id number] MRN: [id number]          Service Type: MED Location: 3W [phone] Attending Physician:  Meredith Leeds Dictated by:   Zigmund Daniel, M.D. Admit Date:  08/07/2001 Discharge Date: 08/14/2001                             Discharge Summary  HISTORY OF PRESENT ILLNESS:  The patient is a 77 year old black female presenting with abdominal pain increasing for a day.  It localized to the right side.  White count was normal, left shift.  X-rays were unremarkable. Exam:  There was tenderness at McBurneys point.  She was admitted.  She has hypertension on spironolactone and, otherwise, her history is generally unremarkable.  HOSPITAL COURSE:  The patient was admitted, given IV fluids and pain medicine. CT scan compatible with acute appendicitis.  This took place on the date of admission, August 08, 2001.  She was found to have acute appendicitis with perforation and localized peritonitis.  She underwent laparoscopy and an open appendectomy.  The patient generally did well postoperatively with no wound problems and slow but steady return of GI function.  I have treated her with broad-spectrum antibiotics and she made steady progress.  She went home on the sixth postoperative day feeling quite well, eating, and bowels moving.  She is to see me back at the office in two to three weeks or as needed.  All staples which were present had been removed.  DIAGNOSES: 1. Acute appendicitis with perforation and peritonitis. 2. Hypertension.  OPERATION:  Laparoscopy and appendectomy.  DISCHARGE CONDITION:  Improved. Dictated by:   Zigmund Daniel, M.D. Attending Physician:  Meredith Leeds DD:  08/20/01 TD:  08/21/01 Job: [id number]

## 2011-02-14 NOTE — Op Note (Signed)
   NAME:  Victoria [name], Victoria [name]                        ACCOUNT NO.:  [id number]   MEDICAL RECORD NO.:  [id number]                   PATIENT TYPE:  AMB   LOCATION:  ENDO                                 FACILITY:  [facility]   PHYSICIAN:  Georgiana Spinner, M.D.                 DATE OF BIRTH:  1921/03/30   DATE OF PROCEDURE:  10/05/2002  DATE OF DISCHARGE:                                 OPERATIVE REPORT   PROCEDURE:  Colonoscopy.   INDICATIONS:  Hemoccult positivity.   ANESTHESIA:  Demerol 20 mg, Versed 3 mg.   DESCRIPTION OF PROCEDURE:  With the patient mildly sedated in the left  lateral decubitus position, the Olympus videoscopic pediatric colonoscope  was inserted in the rectum and passed through an extremely tortuous sigmoid  colon and entirely tortuous colon throughout.  We finally after pressure  applied and withdrawal and reinsertion and patient's position change, we  were able to reach the cecum as identified by the ileocecal valve and crow's  foot of the cecum.  From this point the colonoscope was slowly withdrawn,  taking circumferential views of the entire colonic mucosa, stopping only  then in the rectum, which appeared normal on direct and showed hemorrhoids  on retroflexed view.  The endoscope was straightened and withdrawn.  The  patient's vital signs and pulse oximetry remained stable.  The patient  tolerated the procedure well without apparent complications.   FINDINGS:  Extremely tortuous colonic examination.  No gross lesions seen  other than hemorrhoids.   PLAN:  See endoscopy note for further details.                                               Georgiana Spinner, M.D.    GMO/MEDQ  D:  10/05/2002  T:  10/05/2002  Job:  [id number]

## 2011-02-14 NOTE — H&P (Signed)
[facility]  Patient:    Victoria [name], Victoria [name] Visit Number: [id number] MRN: [id number]          Service Type: MED Location: 2S [phone] Attending Physician:  Meredith Leeds Dictated by:   Zigmund Daniel, M.D. Admit Date:  08/07/2001                           History and Physical  CHIEF COMPLAINT:  Abdominal pain.  HISTORY OF PRESENT ILLNESS:  The patient is a 77 year old white female with a one-day history of increasing mid abdominal pain which seemed to localize a little more toward the right side.  She was seen at the emergency department where she was found to have some diffuse and right-sided abdominal tenderness but higher than at McBurneys point.  She had a CBC which had a normal white count although there was a slight left shift.  Abdominal x-rays were unremarkable except for some distention of the colon.  The patient has had a cholecystectomy and a C-section but had not had an appendectomy.  There is no history of diverticulitis.  After I saw her, I felt the diagnosis was equivocal and ordered a CT scan which showed some free fluid and showed what appeared to be an acute inflamed appendix lying higher than in the usual local in the pelvis.  The patient is admitted to the hospital for IV fluid resuscitation, IV antibiotics, and appendectomy.  She consents to have this done.  PAST MEDICAL HISTORY:  No chronic GI problems.  She has hypertension and is on Hytrin and spironolactone.  She denies diabetes.  She has had no abdominal operations except as above, and no other major surgeries.  She denied heart and lung disease.  She has had a cough for a couple of months which has been usually nonproductive but nagging her quite a bit.  There is no history of any kidney disease.  ALLERGIES:  PENICILLIN, which causes a rash.  REVIEW OF SYSTEMS, FAMILY HISTORY, AND CHILDHOOD ILLNESSES:  The remainder are unremarkable.  SOCIAL HISTORY:  She  does not smoke or drink.  PHYSICAL EXAMINATION:  GENERAL:  In no acute distress but did look somewhat ill and did not feel well.  Mental status was normal.  VITAL SIGNS:  Not remarkable per nursing report, and there was no fever noted.  HEENT, NECK:  Head, neck, eyes, ears, nose, mouth, and throat are unremarkable.  CHEST:  Clear to auscultation.  HEART:  Rate and rhythm were normal, without murmur or gallop.  ABDOMEN:  Diffusely somewhat tender, a little bit more in the right mid abdomen.  Bowel sounds were present.  No masses noted.  There is a reducible right inguinal hernia.  It was nontender.  EXTREMITIES:  Normal.  PELVIC:  Vaginal was not performed.  RECTAL:  Normal.  A small amount of hard stool was present.  It was heme-negative.  NEUROLOGIC:  Normal.  SKIN:  No lesions noted.  LYMPH NODES:  None enlarged.  VASCULAR:  Good pulses in all extremities.  IMPRESSION: 1. Acute appendicitis with possible perforation. 2. Right inguinal hernia, currently asymptomatic. 3. Hypertension, stable on medication.  PLAN:  IV fluid resuscitation, IV antibiotics, and appendectomy within a few hours. Dictated by:   Zigmund Daniel, M.D. Attending Physician:  Meredith Leeds DD:  08/08/01 TD:  08/09/01 Job: [id number]

## 2011-06-18 ENCOUNTER — Emergency Department (HOSPITAL_COMMUNITY): Payer: Medicare Other

## 2011-06-18 ENCOUNTER — Emergency Department (HOSPITAL_COMMUNITY)
Admission: EM | Admit: 2011-06-18 | Discharge: 2011-06-18 | Disposition: A | Discharge status: Home / Self Care | Payer: Medicare Other | Attending: Emergency Medicine | Admitting: Emergency Medicine

## 2011-06-18 DIAGNOSIS — M542 Cervicalgia: Secondary | ICD-10-CM | POA: Insufficient documentation

## 2011-06-18 DIAGNOSIS — R0989 Other specified symptoms and signs involving the circulatory and respiratory systems: Secondary | ICD-10-CM | POA: Insufficient documentation

## 2011-06-18 DIAGNOSIS — T148XXA Other injury of unspecified body region, initial encounter: Secondary | ICD-10-CM | POA: Insufficient documentation

## 2011-06-18 DIAGNOSIS — M5030 Other cervical disc degeneration, unspecified cervical region: Secondary | ICD-10-CM | POA: Insufficient documentation

## 2011-06-18 DIAGNOSIS — S0993XA Unspecified injury of face, initial encounter: Secondary | ICD-10-CM | POA: Insufficient documentation

## 2011-06-18 DIAGNOSIS — S199XXA Unspecified injury of neck, initial encounter: Secondary | ICD-10-CM | POA: Insufficient documentation

## 2011-06-18 DIAGNOSIS — G319 Degenerative disease of nervous system, unspecified: Secondary | ICD-10-CM | POA: Insufficient documentation

## 2011-06-18 DIAGNOSIS — I10 Essential (primary) hypertension: Secondary | ICD-10-CM | POA: Insufficient documentation

## 2011-06-18 DIAGNOSIS — W1809XA Striking against other object with subsequent fall, initial encounter: Secondary | ICD-10-CM | POA: Insufficient documentation

## 2011-06-18 DIAGNOSIS — R0609 Other forms of dyspnea: Secondary | ICD-10-CM | POA: Insufficient documentation

## 2015-05-01 ENCOUNTER — Other Ambulatory Visit: Payer: Self-pay | Admitting: Endocrinology

## 2015-05-01 ENCOUNTER — Other Ambulatory Visit: Payer: Self-pay | Admitting: Internal Medicine

## 2015-05-01 DIAGNOSIS — N19 Unspecified kidney failure: Secondary | ICD-10-CM

## 2015-05-29 ENCOUNTER — Ambulatory Visit
Admission: RE | Admit: 2015-05-29 | Discharge: 2015-05-29 | Disposition: A | Discharge status: Home / Self Care | Payer: Commercial Managed Care - HMO | Source: Ambulatory Visit | Attending: Endocrinology | Admitting: Endocrinology

## 2015-05-29 DIAGNOSIS — N19 Unspecified kidney failure: Secondary | ICD-10-CM

## 2015-06-08 ENCOUNTER — Encounter (HOSPITAL_COMMUNITY): Payer: Self-pay | Admitting: Emergency Medicine

## 2015-06-08 ENCOUNTER — Emergency Department (HOSPITAL_COMMUNITY): Payer: Commercial Managed Care - HMO

## 2015-06-08 ENCOUNTER — Inpatient Hospital Stay (HOSPITAL_COMMUNITY)
Admission: EM | Admit: 2015-06-08 | Discharge: 2015-06-11 | DRG: 682 | Disposition: A | Discharge status: Home Health | Payer: Commercial Managed Care - HMO | Attending: Internal Medicine | Admitting: Internal Medicine

## 2015-06-08 DIAGNOSIS — I129 Hypertensive chronic kidney disease with stage 1 through stage 4 chronic kidney disease, or unspecified chronic kidney disease: Secondary | ICD-10-CM | POA: Diagnosis present

## 2015-06-08 DIAGNOSIS — I10 Essential (primary) hypertension: Secondary | ICD-10-CM | POA: Diagnosis not present

## 2015-06-08 DIAGNOSIS — K5900 Constipation, unspecified: Secondary | ICD-10-CM

## 2015-06-08 DIAGNOSIS — N183 Chronic kidney disease, stage 3 (moderate): Secondary | ICD-10-CM | POA: Diagnosis present

## 2015-06-08 DIAGNOSIS — Z880 Allergy status to penicillin: Secondary | ICD-10-CM | POA: Diagnosis not present

## 2015-06-08 DIAGNOSIS — Z87891 Personal history of nicotine dependence: Secondary | ICD-10-CM | POA: Diagnosis not present

## 2015-06-08 DIAGNOSIS — Z79899 Other long term (current) drug therapy: Secondary | ICD-10-CM | POA: Diagnosis not present

## 2015-06-08 DIAGNOSIS — D631 Anemia in chronic kidney disease: Secondary | ICD-10-CM | POA: Diagnosis present

## 2015-06-08 DIAGNOSIS — E785 Hyperlipidemia, unspecified: Secondary | ICD-10-CM | POA: Diagnosis present

## 2015-06-08 DIAGNOSIS — F0390 Unspecified dementia without behavioral disturbance: Secondary | ICD-10-CM | POA: Diagnosis not present

## 2015-06-08 DIAGNOSIS — G9340 Encephalopathy, unspecified: Secondary | ICD-10-CM | POA: Diagnosis present

## 2015-06-08 DIAGNOSIS — Z91012 Allergy to eggs: Secondary | ICD-10-CM | POA: Diagnosis not present

## 2015-06-08 DIAGNOSIS — N281 Cyst of kidney, acquired: Secondary | ICD-10-CM | POA: Diagnosis present

## 2015-06-08 DIAGNOSIS — R531 Weakness: Secondary | ICD-10-CM | POA: Diagnosis present

## 2015-06-08 DIAGNOSIS — D649 Anemia, unspecified: Secondary | ICD-10-CM

## 2015-06-08 DIAGNOSIS — Z91048 Other nonmedicinal substance allergy status: Secondary | ICD-10-CM | POA: Diagnosis not present

## 2015-06-08 DIAGNOSIS — Z888 Allergy status to other drugs, medicaments and biological substances status: Secondary | ICD-10-CM

## 2015-06-08 DIAGNOSIS — I959 Hypotension, unspecified: Secondary | ICD-10-CM | POA: Diagnosis present

## 2015-06-08 DIAGNOSIS — N179 Acute kidney failure, unspecified: Principal | ICD-10-CM | POA: Diagnosis present

## 2015-06-08 DIAGNOSIS — E861 Hypovolemia: Secondary | ICD-10-CM | POA: Diagnosis present

## 2015-06-08 HISTORY — DX: Essential (primary) hypertension: I10

## 2015-06-08 HISTORY — DX: Disorder of kidney and ureter, unspecified: N28.9

## 2015-06-08 LAB — COMPREHENSIVE METABOLIC PANEL
ALK PHOS: 63 U/L (ref 38–126)
ALT: 36 U/L (ref 14–54)
ANION GAP: 14 (ref 5–15)
AST: 40 U/L (ref 15–41)
Albumin: 3.5 g/dL (ref 3.5–5.0)
BILIRUBIN TOTAL: 0.5 mg/dL (ref 0.3–1.2)
BUN: 84 mg/dL — ABNORMAL HIGH (ref 6–20)
CALCIUM: 8.9 mg/dL (ref 8.9–10.3)
CO2: 22 mmol/L (ref 22–32)
CREATININE: 3.76 mg/dL — AB (ref 0.44–1.00)
Chloride: 108 mmol/L (ref 101–111)
GFR, EST AFRICAN AMERICAN: 11 mL/min — AB (ref >60)
GFR, EST NON AFRICAN AMERICAN: 10 mL/min — AB (ref >60)
Glucose, Bld: 153 mg/dL — ABNORMAL HIGH (ref 65–99)
Potassium: 3.9 mmol/L (ref 3.5–5.1)
SODIUM: 144 mmol/L (ref 135–145)
TOTAL PROTEIN: 7.9 g/dL (ref 6.5–8.1)

## 2015-06-08 LAB — CBC WITH DIFFERENTIAL/PLATELET
BASOS ABS: 0 10*3/uL (ref 0.0–0.1)
BASOS PCT: 0 % (ref 0–1)
EOS PCT: 1 % (ref 0–5)
Eosinophils Absolute: 0.1 10*3/uL (ref 0.0–0.7)
HEMATOCRIT: 30.2 % — AB (ref 36.0–46.0)
Hemoglobin: 9.9 g/dL — ABNORMAL LOW (ref 12.0–15.0)
LYMPHS PCT: 13 % (ref 12–46)
Lymphs Abs: 1.2 10*3/uL (ref 0.7–4.0)
MCH: 31.7 pg (ref 26.0–34.0)
MCHC: 32.8 g/dL (ref 30.0–36.0)
MCV: 96.8 fL (ref 78.0–100.0)
MONO ABS: 1 10*3/uL (ref 0.1–1.0)
MONOS PCT: 11 % (ref 3–12)
NEUTROS ABS: 7.3 10*3/uL (ref 1.7–7.7)
Neutrophils Relative %: 75 % (ref 43–77)
PLATELETS: 192 10*3/uL (ref 150–400)
RBC: 3.12 MIL/uL — ABNORMAL LOW (ref 3.87–5.11)
RDW: 13.8 % (ref 11.5–15.5)
WBC: 9.7 10*3/uL (ref 4.0–10.5)

## 2015-06-08 LAB — URINALYSIS, ROUTINE W REFLEX MICROSCOPIC
Bilirubin Urine: NEGATIVE
GLUCOSE, UA: NEGATIVE mg/dL
KETONES UR: NEGATIVE mg/dL
Leukocytes, UA: NEGATIVE
Nitrite: NEGATIVE
PH: 5.5 (ref 5.0–8.0)
PROTEIN: 30 mg/dL — AB
Specific Gravity, Urine: 1.014 (ref 1.005–1.030)
Urobilinogen, UA: 0.2 mg/dL (ref 0.0–1.0)

## 2015-06-08 LAB — TROPONIN I: Troponin I: <0.03 ng/mL (ref <0.031)

## 2015-06-08 LAB — CBC
HCT: 30.1 % — ABNORMAL LOW (ref 36.0–46.0)
HEMOGLOBIN: 9.9 g/dL — AB (ref 12.0–15.0)
MCH: 31.8 pg (ref 26.0–34.0)
MCHC: 32.9 g/dL (ref 30.0–36.0)
MCV: 96.8 fL (ref 78.0–100.0)
PLATELETS: 191 10*3/uL (ref 150–400)
RBC: 3.11 MIL/uL — AB (ref 3.87–5.11)
RDW: 13.6 % (ref 11.5–15.5)
WBC: 10.4 10*3/uL (ref 4.0–10.5)

## 2015-06-08 LAB — RETICULOCYTES
RBC.: 3.11 MIL/uL — AB (ref 3.87–5.11)
RETIC COUNT ABSOLUTE: 43.5 10*3/uL (ref 19.0–186.0)
RETIC CT PCT: 1.4 % (ref 0.4–3.1)

## 2015-06-08 LAB — CREATININE, SERUM
CREATININE: 3.53 mg/dL — AB (ref 0.44–1.00)
GFR, EST AFRICAN AMERICAN: 12 mL/min — AB (ref >60)
GFR, EST NON AFRICAN AMERICAN: 10 mL/min — AB (ref >60)

## 2015-06-08 LAB — URINE MICROSCOPIC-ADD ON

## 2015-06-08 LAB — LACTIC ACID, PLASMA
LACTIC ACID, VENOUS: 1.2 mmol/L (ref 0.5–2.0)
Lactic Acid, Venous: 1.6 mmol/L (ref 0.5–2.0)

## 2015-06-08 LAB — FERRITIN: Ferritin: 478 ng/mL — ABNORMAL HIGH (ref 11–307)

## 2015-06-08 LAB — IRON AND TIBC
Iron: 15 ug/dL — ABNORMAL LOW (ref 28–170)
Saturation Ratios: 9 % — ABNORMAL LOW (ref 10.4–31.8)
TIBC: 158 ug/dL — ABNORMAL LOW (ref 250–450)
UIBC: 143 ug/dL

## 2015-06-08 LAB — VITAMIN B12: VITAMIN B 12: 235 pg/mL (ref 180–914)

## 2015-06-08 LAB — FOLATE: FOLATE: 11.7 ng/mL (ref >5.9)

## 2015-06-08 MED ORDER — HEPARIN SODIUM (PORCINE) 5000 UNIT/ML IJ SOLN
5000.0000 [IU] | Freq: Three times a day (TID) | INTRAMUSCULAR | Status: DC
  Administered 2015-06-08 – 2015-06-11 (×6): 5000 [IU] via SUBCUTANEOUS
  Filled 2015-06-08 (×7): qty 1

## 2015-06-08 MED ORDER — ACETAMINOPHEN 325 MG PO TABS
650.0000 mg | ORAL_TABLET | Freq: Four times a day (QID) | ORAL | Status: DC | PRN
  Administered 2015-06-09: 650 mg via ORAL
  Filled 2015-06-08: qty 2

## 2015-06-08 MED ORDER — ONDANSETRON HCL 4 MG/2ML IJ SOLN: 4.0000 mg | Freq: Four times a day (QID) | INTRAMUSCULAR | Status: DC | PRN

## 2015-06-08 MED ORDER — PRAVASTATIN SODIUM 20 MG PO TABS
80.0000 mg | ORAL_TABLET | Freq: Every day | ORAL | Status: DC
  Administered 2015-06-08 – 2015-06-11 (×4): 80 mg via ORAL
  Filled 2015-06-08: qty 1
  Filled 2015-06-08 (×3): qty 4

## 2015-06-08 MED ORDER — SODIUM CHLORIDE 0.9 % IV BOLUS (SEPSIS)
500.0000 mL | Freq: Once | INTRAVENOUS | Status: AC
  Administered 2015-06-08: 500 mL via INTRAVENOUS

## 2015-06-08 MED ORDER — ACETAMINOPHEN 650 MG RE SUPP: 650.0000 mg | Freq: Four times a day (QID) | RECTAL | Status: DC | PRN

## 2015-06-08 MED ORDER — SODIUM CHLORIDE 0.9 % IV SOLN
INTRAVENOUS | Status: DC
  Administered 2015-06-08: 23:00:00 via INTRAVENOUS

## 2015-06-08 MED ORDER — SODIUM CHLORIDE 0.9 % IV SOLN
INTRAVENOUS | Status: DC
  Administered 2015-06-08: 18:00:00 via INTRAVENOUS

## 2015-06-08 MED ORDER — ALUM & MAG HYDROXIDE-SIMETH 200-200-20 MG/5ML PO SUSP: 30.0000 mL | Freq: Four times a day (QID) | ORAL | Status: DC | PRN

## 2015-06-08 MED ORDER — ONDANSETRON HCL 4 MG PO TABS: 4.0000 mg | ORAL_TABLET | Freq: Four times a day (QID) | ORAL | Status: DC | PRN

## 2015-06-08 MED ORDER — SODIUM CHLORIDE 0.9 % IV SOLN: Freq: Once | INTRAVENOUS | Status: DC

## 2015-06-08 NOTE — ED Notes (Signed)
Family at bedside. 

## 2015-06-08 NOTE — ED Provider Notes (Signed)
CSN: [id number]     Arrival date & time 06/08/15  1030 History   First MD Initiated Contact with Patient 06/08/15 1033     Chief Complaint  Patient presents with  . Weakness     (Consider location/radiation/quality/duration/timing/severity/associated sxs/prior Treatment) HPI Comments: 81 y.o. Female with HTN, recent renal failure diagnosis presents for generalized weakness.  The patient was brought from home by EMS for a call for weakness.  The patient states that over the last few weeks she has been progressively more weak and that she can no longer walk with her walker.  She says that she has not been having bowel movements as frequently as usual and per EMS family said the last bowel movement was 6 days ago.  Patient denies abdominal pain or distension.  She states that she is passing flatus.  She has not been eating or drinking like usual because she has not had an appetite.  Denies fever, chills, cough.  No falls or injuries.  Has felt pain all over when she moves.  Per EMS and patient the patient may need to start dialysis but they need to complete more renal testing before this is decided.  Patient is a 81 y.o. female presenting with weakness.  Weakness Pertinent negatives include no chest pain, no abdominal pain, no headaches and no shortness of breath.    Past Medical History  Diagnosis Date  . Hypertension   . Renal disorder    History reviewed. No pertinent past surgical history. History reviewed. No pertinent family history. Social History  Substance Use Topics  . Smoking status: Former Games developer  . Smokeless tobacco: None  . Alcohol Use: No   OB History    No data available     Review of Systems  Constitutional: Positive for appetite change and fatigue. Negative for fever, chills and diaphoresis.  HENT: Negative for congestion, postnasal drip and rhinorrhea.   Eyes: Negative for pain and redness.  Respiratory: Negative for cough, chest tightness and shortness of breath.    Cardiovascular: Negative for chest pain and palpitations.  Gastrointestinal: Negative for nausea, vomiting, abdominal pain and diarrhea.  Genitourinary: Negative for hematuria, flank pain and decreased urine volume.  Musculoskeletal: Positive for myalgias. Negative for back pain and neck pain.  Skin: Negative for rash.  Neurological: Positive for weakness (generalized without focality). Negative for dizziness and headaches.  Hematological: Does not bruise/bleed easily.      Allergies  Eggs or egg-derived products; Grapeseed extract; Nyquil multi-symptom; and Penicillins  Home Medications   Prior to Admission medications   Medication Sig Start Date End Date Taking? Authorizing Provider  amLODipine (NORVASC) 5 MG tablet Take 1 tablet by mouth daily. 05/26/15  Yes Historical Provider, MD  furosemide (LASIX) 80 MG tablet Take 120 mg by mouth daily. 05/29/15  Yes Historical Provider, MD  losartan (COZAAR) 50 MG tablet Take 50 mg by mouth daily.   Yes Historical Provider, MD  metoprolol succinate (TOPROL-XL) 50 MG 24 hr tablet Take 1 tablet by mouth daily. 04/16/15  Yes Historical Provider, MD  potassium chloride SA (K-DUR,KLOR-CON) 20 MEQ tablet Take 20 mEq by mouth daily.   Yes Historical Provider, MD  pravastatin (PRAVACHOL) 80 MG tablet Take 80 mg by mouth daily. 05/21/15  Yes Historical Provider, MD   BP 103/65 mmHg  Pulse 65  Temp(Src) 98.3 F (36.8 C) (Oral)  Resp 22  SpO2 98% Physical Exam  Constitutional: She is oriented to person, place, and time.  Non-toxic appearance. She does  not have a sickly appearance. She does not appear ill. No distress.  HENT:  Head: Normocephalic and atraumatic.  Right Ear: External ear normal.  Left Ear: External ear normal.  Mouth/Throat: Oropharynx is clear and moist. No oropharyngeal exudate.  Eyes: EOM are normal. Pupils are equal, round, and reactive to light.  Neck: Normal range of motion. Neck supple.  Cardiovascular: Normal rate, regular  rhythm and intact distal pulses.   Pulmonary/Chest: Effort normal. No respiratory distress. She has no wheezes. She has no rales.  Abdominal: Soft. She exhibits no distension. There is no tenderness. There is no guarding.  Musculoskeletal: She exhibits edema (trace, bilateral, symmetric of the lower extermities).  Neurological: She is alert and oriented to person, place, and time.  Skin: Skin is warm and dry. No rash noted. She is not diaphoretic.  Vitals reviewed.   ED Course  Procedures (including critical care time) Labs Review Labs Reviewed  COMPREHENSIVE METABOLIC PANEL - Abnormal; Notable for the following:    Glucose, Bld 153 (*)    BUN 84 (*)    Creatinine, Ser 3.76 (*)    GFR calc non Af Amer 10 (*)    GFR calc Af Amer 11 (*)    All other components within normal limits  URINALYSIS, ROUTINE W REFLEX MICROSCOPIC (NOT AT [facility]) - Abnormal; Notable for the following:    APPearance CLOUDY (*)    Hgb urine dipstick SMALL (*)    Protein, ur 30 (*)    All other components within normal limits  CBC WITH DIFFERENTIAL/PLATELET - Abnormal; Notable for the following:    RBC 3.12 (*)    Hemoglobin 9.9 (*)    HCT 30.2 (*)    All other components within normal limits  URINE MICROSCOPIC-ADD ON - Abnormal; Notable for the following:    Bacteria, UA FEW (*)    Casts HYALINE CASTS (*)    All other components within normal limits  URINE CULTURE  LACTIC ACID, PLASMA  LACTIC ACID, PLASMA  TROPONIN I    Imaging Review Dg Chest 2 View  06/08/2015   CLINICAL DATA:  Weakness, myalgia  EXAM: CHEST  2 VIEW  COMPARISON:  09/12/2014  FINDINGS: Lung volumes are extremely low, with crowding of the bronchovascular markings but no focal opacity. Right hemidiaphragmatic elevation again noted. Heart size is at least mildly enlarged but not well evaluated otherwise. No focal pulmonary opacity. No pleural effusion. S shaped thoracolumbar spine scoliosis noted. No acute osseous abnormality. Right upper  quadrant clips likely indicate previous cholecystectomy.  IMPRESSION: Low volume exam without focal acute finding.   Electronically Signed   By: Christiana Pellant M.D.   On: 06/08/2015 12:27   Dg Abd 2 Views  06/08/2015   CLINICAL DATA:  Myalgia, weakness for 7 days  EXAM: ABDOMEN - 2 VIEW  COMPARISON:  01/21/2009  FINDINGS: Normal bowel gas pattern. Mild stool burden. Right upper quadrant clips are noted. No air-fluid level or bowel dilatation. Leftward curvature of the lumbar spine centered at L2 is noted. Presumed cholecystectomy clips. Vascular calcifications. No free air at decubitus imaging.  IMPRESSION: Mild stool burden.  Normal bowel gas pattern.   Electronically Signed   By: Christiana Pellant M.D.   On: 06/08/2015 12:29   I have personally reviewed and evaluated these images and lab results as part of my medical decision-making.   EKG Interpretation   Date/Time:  Friday June 08 2015 11:06:43 EDT Ventricular Rate:  70 PR Interval:  160 QRS Duration: 78  QT Interval:  384 QTC Calculation: 414 R Axis:   -30 Text Interpretation:  Sinus rhythm Inferior infarct, old Probable anterior  infarct, age indeterminate No acute changes from previous tracing  Confirmed by Tyrone Apple ([id number]) on 06/08/2015 12:14:08 PM      MDM  Patient was seen and evaluated in stable condition.  Discussed history further with son who said patient has significantly declined over the last 3 days.  He believes her urine output has decreased although patient still making urine.  Laboratory results and results reviewed.  No sign of infection.  BUN and Cr significantly elevated.  Other labs unremarkable for patient.    2:00 PM Discussed with nephrology who agreed with plan for the patient to be admitted to medicine and that they would follow the patient in consultation at this time.  D[facility] Dr[facility] Butler Denmark [facility] who agreed with admission and patient was admitted with continued IV hydration  and with nephrology following in consultation.  Final diagnoses:  Constipation    1. Acute renal failure  2. Generalized Weakness    Leta Baptist, MD 06/08/15 1544

## 2015-06-08 NOTE — H&P (Addendum)
[facility] History and Physical  Victoria [name] ZOX:[id number] DOB: [birth year] DOA: 06/08/2015   PCP: Raeford Razor, MD    Chief Complaint: brought in for weakness  HPI: Victoria [name] is a 81 y.o. female with PMH of hypertension and chronic kidney disease stage III. The patient is brought in by her family due to weakness. The patient is currently confused and unable to give me a good history. No family in the room. She states she's she is here because of belly pain. According to her son who I've spoken with over the phone, she was ambulating with a walker up until about 6 or 7 days ago and due to this weakness they brought her in. Patient has no complaints of cough shortness of breath nausea or vomiting.   Review of systems: Not accurate due to confusion  Past Medical History  Diagnosis Date  . Hypertension   . Renal disorder     History reviewed. No pertinent past surgical history.  Social History:  Social History   Social History  . Marital Status: Single    Spouse Name: N/A  . Number of Children: N/A  . Years of Education: N/A   Occupational History  . Not on file.   Social History Main Topics  . Smoking status: Former Games developer  . Smokeless tobacco: Not on file  . Alcohol Use: No  . Drug Use: Not on file  . Sexual Activity: Not on file   Other Topics Concern  . Not on file   Social History Narrative  . No narrative on file    Lives at home with nephew   Allergies  Allergen Reactions  . Eggs Or Egg-Derived Products     Unknown reaction, can eat in cake but cant eat hardboiled, scrambled, etc  . Grapeseed Extract [Nutritional Supplements] Swelling    Grapes cause swelling  . Nyquil Multi-Symptom [Pseudoeph-Doxylamine-Dm-Apap]     Cant take Nyquil the brand/company pt can not take their products  . Penicillins         Family history:  History reviewed. No pertinent family history. Patient unable to tell me    Prior to Admission medications    Medication Sig Start Date End Date Taking? Authorizing Provider  amLODipine (NORVASC) 5 MG tablet Take 1 tablet by mouth daily. 05/26/15  Yes Historical Provider, MD  furosemide (LASIX) 80 MG tablet Take 120 mg by mouth daily. 05/29/15  Yes Historical Provider, MD  losartan (COZAAR) 50 MG tablet Take 50 mg by mouth daily.   Yes Historical Provider, MD  metoprolol succinate (TOPROL-XL) 50 MG 24 hr tablet Take 1 tablet by mouth daily. 04/16/15  Yes Historical Provider, MD  potassium chloride SA (K-DUR,KLOR-CON) 20 MEQ tablet Take 20 mEq by mouth daily.   Yes Historical Provider, MD  pravastatin (PRAVACHOL) 80 MG tablet Take 80 mg by mouth daily. 05/21/15  Yes Historical Provider, MD     Physical Exam: Filed Vitals:   06/08/15 1200 06/08/15 1400 06/08/15 1430 06/08/15 1500  BP: 110/56 118/60 115/64 103/65  Pulse: 65     Temp:      TempSrc:      Resp: 20 24 30 22   SpO2: 98%        General: Awake and alert, elderly thin female, no acute distress HEENT: Normocephalic and Atraumatic, Mucous membranes dry                PERRLA; EOM intact; No scleral icterus,  Nares: Patent, Oropharynx: Clear, Fair Dentition                 Neck: FROM, no cervical lymphadenopathy, thyromegaly, carotid bruit or JVD;  Breasts: deferred CHEST WALL: No tenderness  CHEST: Normal respiration, clear to auscultation bilaterally  HEART: Regular rate and rhythm; no murmurs rubs or gallops  BACK: No kyphosis or scoliosis; no CVA tenderness  GI: Positive Bowel Sounds, soft, non-tender; no masses, no organomegaly Rectal Exam: deferred MSK: No cyanosis, clubbing, or edema- feet quite tender to the touch Genitalia: not examined  SKIN:  no rash or ulceration  CNS: Alert- she is aware that she is in the hospital but not sure why she is here, does not know the month of the year or her age, Nonfocal exam, CN 2-12 intact  Labs on Admission:  Basic Metabolic Panel:  Recent Labs Lab 06/08/15 1123  NA 144   K 3.9  CL 108  CO2 22  GLUCOSE 153*  BUN 84*  CREATININE 3.76*  CALCIUM 8.9   Liver Function Tests:  Recent Labs Lab 06/08/15 1123  AST 40  ALT 36  ALKPHOS 63  BILITOT 0.5  PROT 7.9  ALBUMIN 3.5   No results for input(s): LIPASE, AMYLASE in the last 168 hours. No results for input(s): AMMONIA in the last 168 hours. CBC:  Recent Labs Lab 06/08/15 1123  WBC 9.7  NEUTROABS 7.3  HGB 9.9*  HCT 30.2*  MCV 96.8  PLT 192   Cardiac Enzymes:  Recent Labs Lab 06/08/15 1123  TROPONINI <0.03    BNP (last 3 results) No results for input(s): BNP in the last 8760 hours.  ProBNP (last 3 results) No results for input(s): PROBNP in the last 8760 hours.  CBG: No results for input(s): GLUCAP in the last 168 hours.  Radiological Exams on Admission: Dg Chest 2 View  06/08/2015   CLINICAL DATA:  Weakness, myalgia  EXAM: CHEST  2 VIEW  COMPARISON:  09/12/2014  FINDINGS: Lung volumes are extremely low, with crowding of the bronchovascular markings but no focal opacity. Right hemidiaphragmatic elevation again noted. Heart size is at least mildly enlarged but not well evaluated otherwise. No focal pulmonary opacity. No pleural effusion. S shaped thoracolumbar spine scoliosis noted. No acute osseous abnormality. Right upper quadrant clips likely indicate previous cholecystectomy.  IMPRESSION: Low volume exam without focal acute finding.   Electronically Signed   By: Christiana Pellant M.D.   On: 06/08/2015 12:27   Dg Abd 2 Views  06/08/2015   CLINICAL DATA:  Myalgia, weakness for 7 days  EXAM: ABDOMEN - 2 VIEW  COMPARISON:  01/21/2009  FINDINGS: Normal bowel gas pattern. Mild stool burden. Right upper quadrant clips are noted. No air-fluid level or bowel dilatation. Leftward curvature of the lumbar spine centered at L2 is noted. Presumed cholecystectomy clips. Vascular calcifications. No free air at decubitus imaging.  IMPRESSION: Mild stool burden.  Normal bowel gas pattern.   Electronically  Signed   By: Christiana Pellant M.D.   On: 06/08/2015 12:29    EKG: Independently reviewed. Normal sinus rhythm  Assessment/Plan Principal Problem:   Acute renal failure on CK D3 - May be prerenal vs ATN -she is making urine and is not hyperkalemic-no needs for dialysis at this time  - Hold furosemide and Cozaar and hydrate slowly  Active Problems:  Generalized weakness - possibly due to ARF/ Hypovolemia - PT eval    Hypertension -Currently hypotensive-possibly due to hypovolemia- - hold metoprolol  Acute encephalopathy - According to her son, she is usually very sharp and does not have dementia-follow mental status    Anemia - Check anemia panel tomorrow morning and follow hemoglobin with hydration  Hyperlipidemia - resume Pravachol   Consulted: none  Code Status: Full code  Family Communication: son- Luverta Korte  DVT Prophylaxis: Heparin  Time spent: 60 min  Gerrit Rafalski, MD [facility]  If 7PM-7AM, please contact night-coverage [email or web address] 06/08/2015, 3:47 PM

## 2015-06-08 NOTE — ED Notes (Signed)
Bed: WA19 Expected date:  Expected time:  Means of arrival:  Comments: 

## 2015-06-08 NOTE — ED Notes (Signed)
Pt sent by EMS from home, family states pt has been c/o of generalized body aches and increasing weakness x 7 days.

## 2015-06-08 NOTE — Consult Note (Signed)
Renal Service Consult Note [facility]  Victoria [name] 06/08/2015 Victoria [name] Requesting Physician:  Dr Victoria [name]  Reason for Consult:  Renal failure HPI: The patient is a 81 y.o. year-old with long hx of HTN. She is a very poor historian, no family here right now.  Patient has hx of renal failure and was seen at [facility] within the past two weeks for CKD with creat in the 3-4 range.  Seen by Dr Victoria [name] and diagnosis was CKD.    Presenting to ED sent by familiy who were concerned about progressive weakness, frailness, inability to ambulate and poor appetite. Can no longer walk.  Worsening over the past several weeks. BM's are not as frequent.  No abd pain.  Not eating or drinking as much as usual.  No appetite. Pain all over when she moves. This history obtained primarily from the ED notes. No family here at this time.   Patient denies history of MI, stroke, stents or major surgery.  Only PMH is HTN.  No prior hospital admissions here.   Past Medical History  Past Medical History  Diagnosis Date  . Hypertension   . Renal disorder    Past Surgical History History reviewed. No pertinent past surgical history. Family History History reviewed. No pertinent family history. Social History  reports that she has quit smoking. She does not have any smokeless tobacco history on file. She reports that she does not drink alcohol. Her drug history is not on file. Allergies  Allergies  Allergen Reactions  . Eggs Or Egg-Derived Products     Unknown reaction, can eat in cake but cant eat hardboiled, scrambled, etc  . Grapeseed Extract [Nutritional Supplements] Swelling    Grapes cause swelling  . Nyquil Multi-Symptom [Pseudoeph-Doxylamine-Dm-Apap]     Cant take Nyquil the brand/company pt can not take their products  . Penicillins        Home medications Prior to Admission medications   Medication Sig Start Date End Date Taking? Authorizing Provider  amLODipine (NORVASC) 5 MG  tablet Take 1 tablet by mouth daily. 05/26/15  Yes Historical Provider, MD  furosemide (LASIX) 80 MG tablet Take 120 mg by mouth daily. 05/29/15  Yes Historical Provider, MD  losartan (COZAAR) 50 MG tablet Take 50 mg by mouth daily.   Yes Historical Provider, MD  metoprolol succinate (TOPROL-XL) 50 MG 24 hr tablet Take 1 tablet by mouth daily. 04/16/15  Yes Historical Provider, MD  potassium chloride SA (K-DUR,KLOR-CON) 20 MEQ tablet Take 20 mEq by mouth daily.   Yes Historical Provider, MD  pravastatin (PRAVACHOL) 80 MG tablet Take 80 mg by mouth daily. 05/21/15  Yes Historical Provider, MD   Liver Function Tests  Recent Labs Lab 06/08/15 1123  AST 40  ALT 36  ALKPHOS 63  BILITOT 0.5  PROT 7.9  ALBUMIN 3.5   No results for input(s): LIPASE, AMYLASE in the last 168 hours. CBC  Recent Labs Lab 06/08/15 1123 06/08/15 1600  WBC 9.7 10.4  NEUTROABS 7.3  --   HGB 9.9* 9.9*  HCT 30.2* 30.1*  MCV 96.8 96.8  PLT 192 191   Basic Metabolic Panel  Recent Labs Lab 06/08/15 1123 06/08/15 1600  NA 144  --   K 3.9  --   CL 108  --   CO2 22  --   GLUCOSE 153*  --   BUN 84*  --   CREATININE 3.76* 3.53*  CALCIUM 8.9  --     Filed Vitals:  06/08/15 1500 06/08/15 1530 06/08/15 1600 06/08/15 1633  BP: 103/65 108/64 106/61 115/59  Pulse:  72  75  Temp:    97.3 F (36.3 C)  TempSrc:    Oral  Resp: Height:     (1.499 m)  Weight:    53.8 kg (118 lb 9.7 oz)  SpO2:  100%  97%   Exam Frail, elderly , no distress No rash, cyanosis or gangrene Sclera anicteric, throat clear Flat neck veins Rales L base, R clear Normal HS, RRR no MRG Abd tender lower abd, soft, no mass or ascites Flanks no CVAT Pitting edema 1-2+ feet and ankles bilat Neuro is very weak 1/5 legs and 3/5 arms No asterixis, +tremulous, Ox 2     UA - cloudy, 30 prot, 0-2 rbc/ wbc Renal US - L 6.9 cm/ atrophied, no hydro   R 8.8 cm/ ^'d echo/ atrophied, no hydro CXR - very low lung  volumes  Assessment: 1. Renal failure - appears chronic and probably severe judging by the recent outpatient labs done at [facility] and the appearance of the kidneys on the [place] here.  She is likely becoming uremic with her loss of appetite and strength.  She probably has HTN'sive nephrosclerosis , or possibly some TI injury. No myeloma markers present Victoria [name], very low Hb).  Not sure she would do well on dialysis at her age.  Will need to discuss with family in the morning. For now start cautious IVF"s at 65 cc/hr.  Will follow.  2. HTN 3. Confusion , mild 4. Anemia, mild, prob due to CKD   Plan- as above  Victoria Moselle MD (pgr) [phone]    [phone] 06/08/2015, 7:41 PM

## 2015-06-09 LAB — CBC
HCT: 29.9 % — ABNORMAL LOW (ref 36.0–46.0)
Hemoglobin: 9.8 g/dL — ABNORMAL LOW (ref 12.0–15.0)
MCH: 31.6 pg (ref 26.0–34.0)
MCHC: 32.8 g/dL (ref 30.0–36.0)
MCV: 96.5 fL (ref 78.0–100.0)
PLATELETS: 197 10*3/uL (ref 150–400)
RBC: 3.1 MIL/uL — AB (ref 3.87–5.11)
RDW: 13.8 % (ref 11.5–15.5)
WBC: 8.8 10*3/uL (ref 4.0–10.5)

## 2015-06-09 LAB — URINE CULTURE: CULTURE: NO GROWTH

## 2015-06-09 MED ORDER — VITAMIN B-12 1000 MCG PO TABS
1000.0000 ug | ORAL_TABLET | Freq: Every day | ORAL | Status: DC
  Administered 2015-06-09 – 2015-06-11 (×3): 1000 ug via ORAL
  Filled 2015-06-09 (×4): qty 1

## 2015-06-09 NOTE — Progress Notes (Signed)
[facility] Progress Note   Victoria [name]  ZOX:[id number]  DOB: 08/09/1921  DOA: 06/08/2015 PCP: Victoria Razor, MD  Brief narrative: Victoria [name] is a 81 y.o. female with PMH of hypertension and chronic kidney disease stage III. The patient is brought in by her family due to weakness. Found to be confused in the ER and have acute renal failure. According to the son she is not usually confused at baseline.   Subjective: Continues to be confused-has no complaints.  Assessment/Plan: Principal Problem:  Acute renal failure?  - May be prerenal vs ATN -she is making urine and is not hyperkalemic- -Nephrology feels that her renal failure is chronic-creatinine has improved only slightly this morning- neurology to recommend further dialysis is indicated -Continue to Hold furosemide and Cozaar and hydrate slowly - Renal ultrasound (8/16) shows bilateral renal atrophy right greater than left and simple appearing cysts bilaterally  Active Problems:  Generalized weakness - possibly due to ARF/ Hypovolemia - PT eval   Hypertension -Hypotension on admission-normotensive today-  hold metoprolol   Acute encephalopathy - According to her son, she is usually very sharp and does not have dementia-follow mental status  Low normal B-12 -We'll start to replace   Anemia -Anemia panel reviewed- Anemia of chronic disease  Hyperlipidemia - resume Pravachol    Code Status:     Code Status Orders        Start     Ordered   06/08/15 1555  Full code   Continuous     06/08/15 1555     Disposition Plan: To be determined DVT prophylaxis: Heparin Consultants: Nephrology Procedures: Appt with PCP:  Antibiotics: Anti-infectives    None      Objective: Filed Weights   06/08/15 1633  Weight: 53.8 kg (118 lb 9.7 oz)    Intake/Output Summary (Last 24 hours) at 06/09/15 1357 Last data filed at 06/09/15 1355  Gross per 24 hour  Intake    240 ml  Output    400 ml   Net   -160 ml     Vitals Filed Vitals:   06/08/15 1633 06/08/15 2048 06/09/15 0519 06/09/15 1350  BP: 115/59 111/49 129/74 124/60  Pulse: 75 83 70 73  Temp: 97.3 F (36.3 C) 97.2 F (36.2 C) 98.4 F (36.9 C) 98.3 F (36.8 C)  TempSrc: Oral Oral Oral Oral  Resp: 20 18 16 16   Height: 4\' 11"  (1.499 m)     Weight: 53.8 kg (118 lb 9.7 oz)     SpO2: 97% 100% 100% 99%    Exam:  General:  Pt is alert, not in acute distress  HEENT: No icterus, No thrush, oral mucosa moist  Cardiovascular: regular rate and rhythm, S1/S2 No murmur  Respiratory: clear to auscultation bilaterally   Abdomen: Soft, +Bowel sounds, non tender, non distended, no guarding  MSK: No LE edema, cyanosis or clubbing  Data Reviewed: Basic Metabolic Panel:  Recent Labs Lab 06/08/15 1123 06/08/15 1600  NA 144  --   K 3.9  --   CL 108  --   CO2 22  --   GLUCOSE 153*  --   BUN 84*  --   CREATININE 3.76* 3.53*  CALCIUM 8.9  --    Liver Function Tests:  Recent Labs Lab 06/08/15 1123  AST 40  ALT 36  ALKPHOS 63  BILITOT 0.5  PROT 7.9  ALBUMIN 3.5   No results for input(s): LIPASE, AMYLASE in the last 168 hours. No results  for input(s): AMMONIA in the last 168 hours. CBC:  Recent Labs Lab 06/08/15 1123 06/08/15 1600 06/09/15 0500  WBC 9.7 10.4 8.8  NEUTROABS 7.3  --   --   HGB 9.9* 9.9* 9.8*  HCT 30.2* 30.1* 29.9*  MCV 96.8 96.8 96.5  PLT 192 191 197   Cardiac Enzymes:  Recent Labs Lab 06/08/15 1123  TROPONINI <0.03   BNP (last 3 results) No results for input(s): BNP in the last 8760 hours.  ProBNP (last 3 results) No results for input(s): PROBNP in the last 8760 hours.  CBG: No results for input(s): GLUCAP in the last 168 hours.  Recent Results (from the past 240 hour(s))  Urine culture     Status: None   Collection Time: 06/08/15 11:43 AM  Result Value Ref Range Status   Specimen Description URINE, CATHETERIZED  Final   Special Requests NONE  Final   Culture    Final    NO GROWTH 1 DAY Performed at [facility]    Report Status 06/09/2015 FINAL  Final     Studies: Dg Chest 2 View  06/08/2015   CLINICAL DATA:  Weakness, myalgia  EXAM: CHEST  2 VIEW  COMPARISON:  09/12/2014  FINDINGS: Lung volumes are extremely low, with crowding of the bronchovascular markings but no focal opacity. Right hemidiaphragmatic elevation again noted. Heart size is at least mildly enlarged but not well evaluated otherwise. No focal pulmonary opacity. No pleural effusion. S shaped thoracolumbar spine scoliosis noted. No acute osseous abnormality. Right upper quadrant clips likely indicate previous cholecystectomy.  IMPRESSION: Low volume exam without focal acute finding.   Electronically Signed   By: Christiana Pellant M.D.   On: 06/08/2015 12:27   Dg Abd 2 Views  06/08/2015   CLINICAL DATA:  Myalgia, weakness for 7 days  EXAM: ABDOMEN - 2 VIEW  COMPARISON:  01/21/2009  FINDINGS: Normal bowel gas pattern. Mild stool burden. Right upper quadrant clips are noted. No air-fluid level or bowel dilatation. Leftward curvature of the lumbar spine centered at L2 is noted. Presumed cholecystectomy clips. Vascular calcifications. No free air at decubitus imaging.  IMPRESSION: Mild stool burden.  Normal bowel gas pattern.   Electronically Signed   By: Christiana Pellant M.D.   On: 06/08/2015 12:29    Scheduled Meds:  Scheduled Meds: . heparin  5,000 Units Subcutaneous 3 times per day  . pravastatin  80 mg Oral Daily   Continuous Infusions: . sodium chloride 75 mL/hr at 06/08/15 2324    Time spent on care of this patient: 35 min   Alexsandria Kivett, MD 06/09/2015, 1:57 PM  LOS: 1 day   [facility] Office  [phone] Pager - Text Page per [email or web address] If 7PM-7AM, please contact night-coverage [email or web address]

## 2015-06-09 NOTE — Evaluation (Signed)
Physical Therapy Evaluation Patient Details Name: Victoria [name] MRN: [id number] DOB: 09-Apr-1921 Today's Date: 06/09/2015   History of Present Illness  81 yo female admitted with acute renal failure, weakness. Hx of HTN, renail failure.   Clinical Impression  Bed level eval only: total assist for any and all attempt at mobility. Pt voiced pain with any attempts at mobilizing. Pt unable to tolerate any activity on today.At this time, recommend SNF unless family feels they can continue to manage pt's care at home. Will follow on a trial basis to continue to assess pt's ability to participate with therapy.     Follow Up Recommendations SNF;Supervision/Assistance - 24 hour (depends on progress and family's ability to provide current level of care)    Equipment Recommendations       Recommendations for Other Services       Precautions / Restrictions Precautions Precautions: Fall Restrictions Weight Bearing Restrictions: No      Mobility  Bed Mobility Overal bed mobility: Needs Assistance Bed Mobility: Rolling Rolling: Total assist         General bed mobility comments: Attempted to perform supine to sit, with HOB highly elevated, but pt was unable due to pain. Then attempted to roll towards L side-again pt grunted/groaned-repeatedly voiced pain with any and all attempts  Transfers                 General transfer comment: NT-pt unable  Ambulation/Gait                Stairs            Wheelchair Mobility    Modified Rankin (Stroke Patients Only)       Balance                                             Pertinent Vitals/Pain Pain Assessment: Faces Faces Pain Scale: Hurts whole lot Pain Location: Pt grunts and groans with any attempt to mobilize.  Pain Descriptors / Indicators: Guarding;Grimacing    Home Living Family/patient expects to be discharged to:: Private residence Living Arrangements: Children               Additional Comments: no family present to provide PLOF/home environment info    Prior Function           Comments: no family present to provide PLOF/home environment info     Hand Dominance        Extremity/Trunk Assessment               Lower Extremity Assessment: RLE deficits/detail;LLE deficits/detail RLE Deficits / Details: pt was able to DF/PF foot. Any attempts made to move LE resulted in resistance and grunts/groans LLE Deficits / Details: pt was able to DF/PF foot. Any attempts made to move LE resulted in resistance and grunts/groans     Communication   Communication: No difficulties  Cognition Arousal/Alertness: Awake/alert Behavior During Therapy: WFL for tasks assessed/performed Overall Cognitive Status: No family/caregiver present to determine baseline cognitive functioning Area of Impairment: Orientation Orientation Level: Time                  General Comments      Exercises        Assessment/Plan    PT Assessment Patient needs continued PT services  PT Diagnosis Generalized weakness;Altered mental status;Acute pain   PT Problem List  Decreased strength;Decreased range of motion;Decreased activity tolerance;Decreased mobility;Pain;Decreased cognition  PT Treatment Interventions Functional mobility training;Therapeutic activities;Therapeutic exercise;Patient/family education;Balance training   PT Goals (Current goals can be found in the Care Plan section) Acute Rehab PT Goals Patient Stated Goal: none stated PT Goal Formulation: Patient unable to participate in goal setting Time For Goal Achievement: 06/23/15 Potential to Achieve Goals: Poor    Frequency Min 2X/week   Barriers to discharge        Co-evaluation               End of Session   Activity Tolerance: Patient limited by pain Patient left: in bed;with call bell/phone within reach;with bed alarm set           Time: 1400-1413 PT Time Calculation (min) (ACUTE  ONLY): 13 min   Charges:   PT Evaluation $Initial PT Evaluation Tier I: 1 Procedure     PT G Codes:        Rebeca Alert, MPT Pager: [phone]

## 2015-06-10 DIAGNOSIS — F0390 Unspecified dementia without behavioral disturbance: Secondary | ICD-10-CM

## 2015-06-10 DIAGNOSIS — R531 Weakness: Secondary | ICD-10-CM

## 2015-06-10 LAB — BASIC METABOLIC PANEL
ANION GAP: 10 (ref 5–15)
BUN: 55 mg/dL — ABNORMAL HIGH (ref 6–20)
CALCIUM: 8.2 mg/dL — AB (ref 8.9–10.3)
CO2: 21 mmol/L — ABNORMAL LOW (ref 22–32)
Chloride: 113 mmol/L — ABNORMAL HIGH (ref 101–111)
Creatinine, Ser: 2.51 mg/dL — ABNORMAL HIGH (ref 0.44–1.00)
GFR, EST AFRICAN AMERICAN: 18 mL/min — AB (ref >60)
GFR, EST NON AFRICAN AMERICAN: 16 mL/min — AB (ref >60)
Glucose, Bld: 116 mg/dL — ABNORMAL HIGH (ref 65–99)
Potassium: 3.1 mmol/L — ABNORMAL LOW (ref 3.5–5.1)
SODIUM: 144 mmol/L (ref 135–145)

## 2015-06-10 MED ORDER — POTASSIUM CHLORIDE CRYS ER 20 MEQ PO TBCR: 40.0000 meq | EXTENDED_RELEASE_TABLET | Freq: Two times a day (BID) | ORAL | Status: DC

## 2015-06-10 NOTE — Progress Notes (Signed)
Utilization Review Completed.Victoria [name] T9/07/2015  

## 2015-06-10 NOTE — Discharge Summary (Addendum)
Physician Discharge Summary  Victoria [name] WJX:[id number] DOB: November 27, 1920 DOA: 06/08/2015  PCP: Raeford Razor, MD  Admit date: 06/08/2015 Discharge date: 06/11/2015  Time spent: 60 minutes  Recommendations for Outpatient Follow-up:  1. Bmet in 1 wk- decide if Lasix/ KCL need to be resumed 2. will go home with son and home health- declined the nursing facilities that were offered  Discharge Condition: stable Diet recommendation: regular diet  Discharge Diagnoses:  Principal Problem:   Acute renal failure Active Problems:   Hypotension with h/o Hypertension   Acute encephalopathy   Anemia   Dementia without behavioral disturbance   History of present illness:  Victoria [name] is a 81 y.o. female with PMH of hypertension and chronic kidney disease stage III. The patient is brought in by her family due to weakness. Found have acute renal failure. Please see HPI for further details.   Hospital Course:  Principal Problem:  Acute renal failure on chronic renal failure stage 3  - recently started seeing a nephrologist recently  -she is making urine and is not hyperkalemic and at this time has no needs for dialysis - Renal ultrasound (8/16) shows bilateral renal atrophy right greater than left and simple appearing cysts bilaterally - renal function improved slightly with holding Lasix and hydration-  -nephrology recommending to hold IVF and Lasix as of 9/10- as PO intake quite poor, Nephrology has recommended not to resume Lasix at this point - cont to hold Cozaar -    - not a good candidate for hemodialysis per Neprology which I agree with-extensive conversation with adopted son, Victoria [name] whom she lives with and 2 other sons- they are agree that they have no plans to place her on dialysis at any point- he and family would also like her to be DNR  Active Problems:  Generalized weakness - slow physical decline to a point where she has stopped walking - likely superimposed by  ARF/ Hypovolemia/ hypotension  - PT eval- SNF recommended but family would like to take her home and care for her themselves   Hypotension on admission with h/o Hypertension -having episodes of hypotension without antihypertensives- hold all antihypertensives for now   Acute encephalopathy- underlying dementia per son - mental status appears to have improved as compared to the day of admission- oriented to person and place today- per son, she has had a slow cognitive decline for the past year - low normal B12 may be contributing- started replacement  Low normal B-12 -replacing   Anemia -Anemia panel reviewed- Anemia of chronic disease with low normal B12  Hyperlipidemia - resume Pravachol  Consultations:  nephrology  Discharge Exam: Filed Weights   06/08/15 1633 06/10/15 0745 06/11/15 0521  Weight: 53.8 kg (118 lb 9.7 oz) 52.889 kg (116 lb 9.6 oz) 53.025 kg (116 lb 14.4 oz)   Filed Vitals:   06/11/15 1354  BP: 102/60  Pulse:   Temp:   Resp:     General: AA- oriented to person and place- disoriented to time but , no distress Cardiovascular: RRR, no murmurs  Respiratory: clear to auscultation bilaterally GI: soft, non-tender, non-distended, bowel sound positive MSK: no cyanosis, clubbing edema  Discharge Instructions You were cared for by a hospitalist during your hospital stay. If you have any questions about your discharge medications or the care you received while you were in the hospital after you are discharged, you can call the unit and asked to speak with the hospitalist on call if the hospitalist that took  care of you is not available. Once you are discharged, your primary care physician will handle any further medical issues. Please note that NO REFILLS for any discharge medications will be authorized once you are discharged, as it is imperative that you return to your primary care physician (or establish a relationship with a primary care physician if you do not  have one) for your aftercare needs so that they can reassess your need for medications and monitor your lab values.      Discharge Instructions    Diet - low sodium heart healthy    Complete by:  As directed      Discharge instructions    Complete by:  As directed    Needs to f/u with Renal in 1-2 wks     Increase activity slowly    Complete by:  As directed             Medication List    STOP taking these medications        amLODipine 5 MG tablet  Commonly known as:  NORVASC     furosemide 80 MG tablet  Commonly known as:  LASIX     losartan 50 MG tablet  Commonly known as:  COZAAR     metoprolol succinate 50 MG 24 hr tablet  Commonly known as:  TOPROL-XL     potassium chloride SA 20 MEQ tablet  Commonly known as:  K-DUR,KLOR-CON      TAKE these medications        acetaminophen 325 MG tablet  Commonly known as:  TYLENOL  Take 2 tablets (650 mg total) by mouth every 6 (six) hours as needed for mild pain (or Fever >/= 101).     alum & mag hydroxide-simeth 200-200-20 MG/5ML suspension  Commonly known as:  MAALOX/MYLANTA  Take 30 mLs by mouth every 6 (six) hours as needed for indigestion or heartburn (dyspepsia).     cyanocobalamin 1000 MCG tablet  Take 1 tablet (1,000 mcg total) by mouth daily.     pravastatin 80 MG tablet  Commonly known as:  PRAVACHOL  Take 80 mg by mouth daily.       Allergies  Allergen Reactions  . Eggs Or Egg-Derived Products     Unknown reaction, can eat in cake but cant eat hardboiled, scrambled, etc  . Grapeseed Extract [Nutritional Supplements] Swelling    Grapes cause swelling  . Nyquil Multi-Symptom [Pseudoeph-Doxylamine-Dm-Apap]     Cant take Nyquil the brand/company pt can not take their products  . Other Swelling    RAISINS-  Per family causes swelling  . Penicillins    Follow-up Information    Please follow up.   Why:  with nephrologist in 1-2 wks      Schedule an appointment as soon as possible for a visit with  Raeford Razor, MD.   Specialty:  Emergency Medicine   Why:  in 1 wk   Contact information:   [address] [place] [address] [phone]        The results of significant diagnostics from this hospitalization (including imaging, microbiology, ancillary and laboratory) are listed below for reference.    Significant Diagnostic Studies: Dg Chest 2 View  06/08/2015   CLINICAL DATA:  Weakness, myalgia  EXAM: CHEST  2 VIEW  COMPARISON:  09/12/2014  FINDINGS: Lung volumes are extremely low, with crowding of the bronchovascular markings but no focal opacity. Right hemidiaphragmatic elevation again noted. Heart size is at least mildly enlarged but not  well evaluated otherwise. No focal pulmonary opacity. No pleural effusion. S shaped thoracolumbar spine scoliosis noted. No acute osseous abnormality. Right upper quadrant clips likely indicate previous cholecystectomy.  IMPRESSION: Low volume exam without focal acute finding.   Electronically Signed   By: Christiana Pellant M.D.   On: 06/08/2015 12:27   US Renal  05/29/2015   CLINICAL DATA:  Renal failure, no history of hematuria  EXAM: RENAL / URINARY TRACT ULTRASOUND COMPLETE  COMPARISON:  Abdominal ultrasound dated August 27, 2009  FINDINGS: Right Kidney:  Length: 6.9 cm. The renal cortical echotexture is increased diffusely. There is a mid to lower pole simple appearing cyst measuring 1.9 x 1.5 x 1.5 cm. There is no hydronephrosis.  Left Kidney:  Length: 8.8 cm. The renal cortical echotexture is mildly increased but less conspicuously so than on the right. There is an upper pole cyst measuring 3.4 cm in greatest dimension with an adjacent cyst measuring 2 cm in greatest dimension. There 2 adjacent lower pole cysts, one measuring 1.8 cm and the second 2.2 cm in greatest dimension. There is no hydronephrosis.  Bladder:  Appears normal for degree of bladder distention.  IMPRESSION: 1. Bilateral renal atrophy greater on the right than on the left.  Increased renal cortical echotexture specially on the right is consistent with medical renal disease. There is no hydronephrosis. 2. Bilateral simple appearing cysts.   Electronically Signed   By: David  [place] M.D.   On: 05/29/2015 16:56   Dg Abd 2 Views  06/08/2015   CLINICAL DATA:  Myalgia, weakness for 7 days  EXAM: ABDOMEN - 2 VIEW  COMPARISON:  01/21/2009  FINDINGS: Normal bowel gas pattern. Mild stool burden. Right upper quadrant clips are noted. No air-fluid level or bowel dilatation. Leftward curvature of the lumbar spine centered at L2 is noted. Presumed cholecystectomy clips. Vascular calcifications. No free air at decubitus imaging.  IMPRESSION: Mild stool burden.  Normal bowel gas pattern.   Electronically Signed   By: Christiana Pellant M.D.   On: 06/08/2015 12:29    Microbiology: Recent Results (from the past 240 hour(s))  Urine culture     Status: None   Collection Time: 06/08/15 11:43 AM  Result Value Ref Range Status   Specimen Description URINE, CATHETERIZED  Final   Special Requests NONE  Final   Culture   Final    NO GROWTH 1 DAY Performed at [facility]    Report Status 06/09/2015 FINAL  Final     Labs: Basic Metabolic Panel:  Recent Labs Lab 06/08/15 1123 06/08/15 1600 06/10/15 0503 06/11/15 0427  NA 144  --  144 146*  K 3.9  --  3.1* 3.4*  CL 108  --  113* 114*  CO2 22  --  21* 23  GLUCOSE 153*  --  116* 119*  BUN 84*  --  55* 51*  CREATININE 3.76* 3.53* 2.51* 2.23*  CALCIUM 8.9  --  8.2* 8.6*   Liver Function Tests:  Recent Labs Lab 06/08/15 1123  AST 40  ALT 36  ALKPHOS 63  BILITOT 0.5  PROT 7.9  ALBUMIN 3.5   No results for input(s): LIPASE, AMYLASE in the last 168 hours. No results for input(s): AMMONIA in the last 168 hours. CBC:  Recent Labs Lab 06/08/15 1123 06/08/15 1600 06/09/15 0500 06/11/15 0427  WBC 9.7 10.4 8.8 11.0*  NEUTROABS 7.3  --   --   --   HGB 9.9* 9.9* 9.8* 8.8*  HCT 30.2* 30.1*  29.9* 26.7*  MCV 96.8  96.8 96.5 96.4  PLT 192 191 197 235   Cardiac Enzymes:  Recent Labs Lab 06/08/15 1123  TROPONINI <0.03   BNP: BNP (last 3 results) No results for input(s): BNP in the last 8760 hours.  ProBNP (last 3 results) No results for input(s): PROBNP in the last 8760 hours.  CBG: No results for input(s): GLUCAP in the last 168 hours.     SignedCalvert Cantor, MD [facility] 06/11/2015, 2:56 PM

## 2015-06-10 NOTE — Progress Notes (Signed)
  [facility] Progress Note   Subjective: " i just woke up"  Filed Vitals:   06/09/15 0519 06/09/15 1350 06/09/15 2131 06/10/15 0531  BP: 129/74 124/60 112/71 134/80  Pulse: 70 73 70 81  Temp: 98.4 F (36.9 C) 98.3 F (36.8 C) 97.7 F (36.5 C) 98.2 F (36.8 C)  TempSrc: Oral Oral Oral Oral  Resp: Height:      Weight:      SpO2: 100% 99% 100% 99%   Exam: Awake, frail, very weak No jvd Chest is clear bilat RRR no RG Abd soft ntnd 1-2+ LE edema Neuro diffuse weakness      Assessment: 1. Acute on CRF stage 4 2. Debility 3. Vol - looks euvolemic 4. HTN - BP normal, holding 3 bp meds  Plan - poor dialysis candidate overall, would recommend medical Rx for volume/ HTN, avoid ACEi/ARB, hold diuretics and fluids for now, looks euvolemic. Use MTP/ norvasc for BP if worsens.   Have d/w primary MD. She can f/u with CKD after dc (was just seen there for CKD last week first visit).  Will sign off.     Vinson Moselle MD  pager [phone]    cell [phone]  06/10/2015, 9:50 AM     Recent Labs Lab 06/08/15 1123 06/08/15 1600 06/10/15 0503  NA 144  --  144  K 3.9  --  3.1*  CL 108  --  113*  CO2 22  --  21*  GLUCOSE 153*  --  116*  BUN 84*  --  55*  CREATININE 3.76* 3.53* 2.51*  CALCIUM 8.9  --  8.2*    Recent Labs Lab 06/08/15 1123  AST 40  ALT 36  ALKPHOS 63  BILITOT 0.5  PROT 7.9  ALBUMIN 3.5    Recent Labs Lab 06/08/15 1123 06/08/15 1600 06/09/15 0500  WBC 9.7 10.4 8.8  NEUTROABS 7.3  --   --   HGB 9.9* 9.9* 9.8*  HCT 30.2* 30.1* 29.9*  MCV 96.8 96.8 96.5  PLT 192 191 197   . heparin  5,000 Units Subcutaneous 3 times per day  . potassium chloride  40 mEq Oral BID  . pravastatin  80 mg Oral Daily  . vitamin B-12  1,000 mcg Oral Daily     acetaminophen **OR** acetaminophen, alum & mag hydroxide-simeth, ondansetron **OR** ondansetron (ZOFRAN) IV

## 2015-06-10 NOTE — Progress Notes (Addendum)
[facility] Progress Note   Victoria [name]  LKG:[id number]  DOB: 15-Nov-1920  DOA: 06/08/2015 PCP: Raeford Razor, MD  Brief narrative: Victoria [name] is a 81 y.o. female with PMH of hypertension and chronic kidney disease stage III. The patient is brought in by her family due to weakness. Found to be confused in the ER and have acute renal failure. According to the son she is not usually confused at baseline.   Subjective: More oriented today. No complaints of nausea, vomiting, diarrhea, dyspnea or chest pain.   Assessment/Plan: Principal Problem:  Acute renal failure?  - recently started seeing a nephrologist - May be prerenal vs ATN -she is making urine and is not hyperkalemic- - Renal ultrasound (8/16) shows bilateral renal atrophy right greater than left and simple appearing cysts bilaterally - renal function improved slightly with holding Lasix and hydration- mental status improved considerably  -Continue to Hold furosemide and Cozaar - Nephrology recommending to stop IVF today and to cont to hold lasix - urine output not being recorded as she is incontinent  - not a good candidate for hemodialysis per Neprology- I agree-discussed with adopted son in detail who agrees   Active Problems:  Generalized weakness - possibly due to ARF/ Hypovolemia/ hypotension - PT eval- SNF vs 24 hr home supervision   Hypertension -Hypotension on admission-normotensive yesterday- follow BP to decide when to resume  metoprolol   Acute encephalopathy- underlying dementia per son - mental status appears to have improved as compare to the day of admission- oriented to person and place today  Low normal B-12 -replacing   Anemia -Anemia panel reviewed- Anemia of chronic disease  Hyperlipidemia - resume Pravachol  Code Status: discussed code status with son adopted son, 2 biological son Victoria [name] and Victoria [name] - all in agreement on a DNR status  Disposition Plan: SNF - discussed  with son (adopted grandson) Victoria [name] DVT prophylaxis: Heparin Consultants: Nephrology Procedures: Appt with PCP: requested Antibiotics: Anti-infectives    None      Objective: Filed Weights   06/08/15 1633 06/10/15 0745  Weight: 53.8 kg (118 lb 9.7 oz) 52.889 kg (116 lb 9.6 oz)    Intake/Output Summary (Last 24 hours) at 06/10/15 1117 Last data filed at 06/10/15 0900  Gross per 24 hour  Intake    370 ml  Output      0 ml  Net    370 ml     Vitals Filed Vitals:   06/09/15 1350 06/09/15 2131 06/10/15 0531 06/10/15 0745  BP: 124/60 112/71 134/80   Pulse: 73 70 81   Temp: 98.3 F (36.8 C) 97.7 F (36.5 C) 98.2 F (36.8 C)   TempSrc: Oral Oral Oral   Resp: 16 14 14    Height:      Weight:    52.889 kg (116 lb 9.6 oz)  SpO2: 99% 100% 99%     Exam:  General:  Pt is alert, oriented to person and place, not in acute distress  HEENT: No icterus, No thrush, oral mucosa moist  Cardiovascular: regular rate and rhythm, S1/S2 No murmur  Respiratory: clear to auscultation bilaterally   Abdomen: Soft, +Bowel sounds, non tender, non distended, no guarding  MSK: No LE edema, cyanosis or clubbing  Data Reviewed: Basic Metabolic Panel:  Recent Labs Lab 06/08/15 1123 06/08/15 1600 06/10/15 0503  NA 144  --  144  K 3.9  --  3.1*  CL 108  --  113*  CO2 22  --  21*  GLUCOSE 153*  --  116*  BUN 84*  --  55*  CREATININE 3.76* 3.53* 2.51*  CALCIUM 8.9  --  8.2*   Liver Function Tests:  Recent Labs Lab 06/08/15 1123  AST 40  ALT 36  ALKPHOS 63  BILITOT 0.5  PROT 7.9  ALBUMIN 3.5   No results for input(s): LIPASE, AMYLASE in the last 168 hours. No results for input(s): AMMONIA in the last 168 hours. CBC:  Recent Labs Lab 06/08/15 1123 06/08/15 1600 06/09/15 0500  WBC 9.7 10.4 8.8  NEUTROABS 7.3  --   --   HGB 9.9* 9.9* 9.8*  HCT 30.2* 30.1* 29.9*  MCV 96.8 96.8 96.5  PLT 192 191 197   Cardiac Enzymes:  Recent Labs Lab 06/08/15 1123   TROPONINI <0.03   BNP (last 3 results) No results for input(s): BNP in the last 8760 hours.  ProBNP (last 3 results) No results for input(s): PROBNP in the last 8760 hours.  CBG: No results for input(s): GLUCAP in the last 168 hours.  Recent Results (from the past 240 hour(s))  Urine culture     Status: None   Collection Time: 06/08/15 11:43 AM  Result Value Ref Range Status   Specimen Description URINE, CATHETERIZED  Final   Special Requests NONE  Final   Culture   Final    NO GROWTH 1 DAY Performed at [facility]    Report Status 06/09/2015 FINAL  Final     Studies: Dg Chest 2 View  06/08/2015   CLINICAL DATA:  Weakness, myalgia  EXAM: CHEST  2 VIEW  COMPARISON:  09/12/2014  FINDINGS: Lung volumes are extremely low, with crowding of the bronchovascular markings but no focal opacity. Right hemidiaphragmatic elevation again noted. Heart size is at least mildly enlarged but not well evaluated otherwise. No focal pulmonary opacity. No pleural effusion. S shaped thoracolumbar spine scoliosis noted. No acute osseous abnormality. Right upper quadrant clips likely indicate previous cholecystectomy.  IMPRESSION: Low volume exam without focal acute finding.   Electronically Signed   By: Christiana Pellant M.D.   On: 06/08/2015 12:27   Dg Abd 2 Views  06/08/2015   CLINICAL DATA:  Myalgia, weakness for 7 days  EXAM: ABDOMEN - 2 VIEW  COMPARISON:  01/21/2009  FINDINGS: Normal bowel gas pattern. Mild stool burden. Right upper quadrant clips are noted. No air-fluid level or bowel dilatation. Leftward curvature of the lumbar spine centered at L2 is noted. Presumed cholecystectomy clips. Vascular calcifications. No free air at decubitus imaging.  IMPRESSION: Mild stool burden.  Normal bowel gas pattern.   Electronically Signed   By: Christiana Pellant M.D.   On: 06/08/2015 12:29    Scheduled Meds:  Scheduled Meds: . heparin  5,000 Units Subcutaneous 3 times per day  . potassium chloride  40  mEq Oral BID  . pravastatin  80 mg Oral Daily  . vitamin B-12  1,000 mcg Oral Daily   Continuous Infusions:    Time spent on care of this patient: 35 min   Victoria Dehart, MD 06/10/2015, 11:17 AM  LOS: 2 days   [facility] Office  [phone] Pager - Text Page per [email or web address] If 7PM-7AM, please contact night-coverage [email or web address]

## 2015-06-10 NOTE — Progress Notes (Signed)
  [facility] Progress Note   Subjective: " i just woke up"  Filed Vitals:   06/09/15 0519 06/09/15 1350 06/09/15 2131 06/10/15 0531  BP: 129/74 124/60 112/71 134/80  Pulse: 70 73 70 81  Temp: 98.4 F (36.9 C) 98.3 F (36.8 C) 97.7 F (36.5 C) 98.2 F (36.8 C)  TempSrc: Oral Oral Oral Oral  Resp: Height:      Weight:      SpO2: 100% 99% 100% 99%   Exam: Awake, frail, very weak No jvd Chest is clear bilat RRR no RG Abd soft ntnd 1-2+ LE edema Neuro diffuse weakness      Assessment: 1. Acute on CRF stage 4 2. Debility 3. Vol - looks euvolemic 4. HTN - BP normal, holding 3 bp meds  Plan - poor dialysis candidate overall, would recommend medical Rx for volume/ HTN, avoid ACEi/ARB, hold diuretics and fluids for now, looks euvolemic. Use MTP/ norvasc for BP if worsens.   Have d/w primary MD.     Vinson Moselle MD  pager [phone]    cell [phone]  06/10/2015, 8:08 AM     Recent Labs Lab 06/08/15 1123 06/08/15 1600 06/10/15 0503  NA 144  --  144  K 3.9  --  3.1*  CL 108  --  113*  CO2 22  --  21*  GLUCOSE 153*  --  116*  BUN 84*  --  55*  CREATININE 3.76* 3.53* 2.51*  CALCIUM 8.9  --  8.2*    Recent Labs Lab 06/08/15 1123  AST 40  ALT 36  ALKPHOS 63  BILITOT 0.5  PROT 7.9  ALBUMIN 3.5    Recent Labs Lab 06/08/15 1123 06/08/15 1600 06/09/15 0500  WBC 9.7 10.4 8.8  NEUTROABS 7.3  --   --   HGB 9.9* 9.9* 9.8*  HCT 30.2* 30.1* 29.9*  MCV 96.8 96.8 96.5  PLT 192 191 197   . heparin  5,000 Units Subcutaneous 3 times per day  . pravastatin  80 mg Oral Daily  . vitamin B-12  1,000 mcg Oral Daily     acetaminophen **OR** acetaminophen, alum & mag hydroxide-simeth, ondansetron **OR** ondansetron (ZOFRAN) IV

## 2015-06-11 DIAGNOSIS — F0390 Unspecified dementia without behavioral disturbance: Secondary | ICD-10-CM

## 2015-06-11 LAB — BASIC METABOLIC PANEL
Anion gap: 9 (ref 5–15)
BUN: 51 mg/dL — AB (ref 6–20)
CALCIUM: 8.6 mg/dL — AB (ref 8.9–10.3)
CHLORIDE: 114 mmol/L — AB (ref 101–111)
CO2: 23 mmol/L (ref 22–32)
CREATININE: 2.23 mg/dL — AB (ref 0.44–1.00)
GFR calc non Af Amer: 18 mL/min — ABNORMAL LOW (ref >60)
GFR, EST AFRICAN AMERICAN: 21 mL/min — AB (ref >60)
Glucose, Bld: 119 mg/dL — ABNORMAL HIGH (ref 65–99)
Potassium: 3.4 mmol/L — ABNORMAL LOW (ref 3.5–5.1)
Sodium: 146 mmol/L — ABNORMAL HIGH (ref 135–145)

## 2015-06-11 LAB — CBC
HCT: 26.7 % — ABNORMAL LOW (ref 36.0–46.0)
Hemoglobin: 8.8 g/dL — ABNORMAL LOW (ref 12.0–15.0)
MCH: 31.8 pg (ref 26.0–34.0)
MCHC: 33 g/dL (ref 30.0–36.0)
MCV: 96.4 fL (ref 78.0–100.0)
PLATELETS: 235 10*3/uL (ref 150–400)
RBC: 2.77 MIL/uL — ABNORMAL LOW (ref 3.87–5.11)
RDW: 14 % (ref 11.5–15.5)
WBC: 11 10*3/uL — ABNORMAL HIGH (ref 4.0–10.5)

## 2015-06-11 MED ORDER — AMLODIPINE BESYLATE 5 MG PO TABS: 5.0000 mg | ORAL_TABLET | Freq: Every day | ORAL | Status: DC

## 2015-06-11 MED ORDER — CYANOCOBALAMIN 1000 MCG PO TABS: 1000.0000 ug | ORAL_TABLET | Freq: Every day | ORAL | Status: AC

## 2015-06-11 MED ORDER — ALUM & MAG HYDROXIDE-SIMETH 200-200-20 MG/5ML PO SUSP: 30.0000 mL | Freq: Four times a day (QID) | ORAL | Status: AC | PRN

## 2015-06-11 MED ORDER — ACETAMINOPHEN 325 MG PO TABS: 650.0000 mg | ORAL_TABLET | Freq: Four times a day (QID) | ORAL | Status: AC | PRN

## 2015-06-11 NOTE — Progress Notes (Signed)
CSW received notification that pt needed ambulance transport to home.   CSW met with pt son at bedside and confirmed address.  Per pt son, pt son is awaiting his uncle to come back to the hospital as pt son had left his keys in his uncles truck. Pt son will notify RN once he has the keys in order for RN to arrange ambulance transport.   CSW discussed with RN and provided RN with phone number to call non-emergency transport. If before 5 pm, RN to call [phone]. If after 5 pm, RN to call [phone] option 1 for English and option 3 for non-emergency transport.  Pt address is: [address] [place] [address]  Documents including Gold DNR form and ambulance medical necessity form provided to RN.   No further social work needs identified at this time.  CSW signing off.   Victoria [name], MSW, [place] Work [phone]

## 2015-06-11 NOTE — Care Management Important Message (Signed)
Important Message  Patient Details  Name: Victoria [name] MRN: [id number] Date of Birth: [birth year]   Medicare Important Message Given:  Yes-second notification given    Haskell Flirt 06/11/2015, 12:11 PMImportant Message  Patient Details  Name: Victoria [name] MRN: [id number] Date of Birth: [birth year]   Medicare Important Message Given:  Yes-second notification given    Haskell Flirt 06/11/2015, 12:11 PM

## 2015-06-11 NOTE — Clinical Social Work Placement (Signed)
   CLINICAL SOCIAL WORK PLACEMENT  NOTE  Date:  06/11/2015  Patient Details  Name: KRYSTIAN FERRENTINO MRN: [id number] Date of Birth: Oct 26, 1920  Clinical Social Work is seeking post-discharge placement for this patient at the Skilled  Nursing Facility level of care (*CSW will initial, date and re-position this form in  chart as items are completed):  Yes   Patient/family provided with [place] Clinical Social Work Department's list of facilities offering this level of care within the geographic area requested by the patient (or if unable, by the patient's family).  Yes   Patient/family informed of their freedom to choose among providers that offer the needed level of care, that participate in Medicare, Medicaid or managed care program needed by the patient, have an available bed and are willing to accept the patient.  Yes   Patient/family informed of [facility]'s ownership interest in [facility] and [facility], as well as of the fact that they are under no obligation to receive care at these facilities.  PASRR submitted to EDS on       PASRR number received on       Existing PASRR number confirmed on 06/11/15     FL2 transmitted to all facilities in geographic area requested by pt/family on 06/11/15     FL2 transmitted to all facilities within larger geographic area on       Patient informed that his/her managed care company has contracts with or will negotiate with certain facilities, including the following:        Yes   Patient/family informed of bed offers received.  Patient chooses bed at  (pt son plans for pt to return home with home health services.)     Physician recommends and patient chooses bed at      Patient to be transferred to   on  .  Patient to be transferred to facility by       Patient family notified on   of transfer.  Name of family member notified:        PHYSICIAN       Additional Comment:     _______________________________________________ Orson Eva, LCSW 06/11/2015, 3:00 PM

## 2015-06-11 NOTE — Clinical Social Work Note (Signed)
Clinical Social Work Assessment  Patient Details  Name: Victoria [name] MRN: [id number] Date of Birth: 10-Jun-1921  Date of referral:  06/11/15               Reason for consult:  Discharge Planning                Permission sought to share information with:  Family Supports Permission granted to share information::  Yes, Verbal Permission Granted  Name::     Jaylei Fuerte  Agency::     Relationship::  son  Contact Information:  [phone]  Housing/Transportation Living arrangements for the past 2 months:  Single Family Home Source of Information:  Adult Children Patient Interpreter Needed:  None Criminal Activity/Legal Involvement Pertinent to Current Situation/Hospitalization:    Significant Relationships:  Adult Children Lives with:  Adult Children Do you feel safe going back to the place where you live?  Yes Need for family participation in patient care:  Yes (Comment)  Care giving concerns:  Pt admitted from home with pt son, Ramon Dredge. PT recommending SNF.    Social Worker assessment / plan:  CSW spoke with pt son via telephone, Ramon Dredge who is agreeable to SNF search. Pt son was on the way to the Hospital and CSW notified pt son that CSW will meet with pt son upon his arrival.  CSW initiated SNF search to [facility].  CSW followed up with pt and pt son at bedside regarding SNF bed offers. Pt son reviewed SNF bed offers. Pt bed offers limited as pt has a Geologist, engineering. Pt son inquired if home health services would be covered at home as he was not pleased with the SNF bed offers. CSW notified RNCM who plans to meet with pt and pt son at bedside.  CSW followed up with pt son following pt son meeting RNCM. Pt son states that plan is for pt to return home with home health services and 24 hour care by pt family.   RNCM arranging home health needs  No further social work needs identified at this time.  Employment status:  Retired Automotive engineer PT Recommendations:  Skilled Nursing Facility Information / Referral to community resources:  Skilled Nursing Facility  Patient/Family's Response to care:  Pt alert and oriented x 4, but with intermittent confusion. Pt son supportive and actively involved with pt care at home. Pt son is pt primary caregiver and was not satisfied with SNF bed offers and feels he can meet pt needs at home with the assistance of home health services.  Patient/Family's Understanding of and Emotional Response to Diagnosis, Current Treatment, and Prognosis:  Pt son expressed understanding about pt medical needs and plans for pt to return home.  Emotional Assessment Appearance:  Appears stated age Attitude/Demeanor/Rapport:  Other (pt appropriate) Affect (typically observed):  Quiet Orientation:  Oriented to Self, Oriented to Place, Oriented to  Time, Oriented to Situation (pt with intermittent confusion) Alcohol / Substance use:  Not Applicable Psych involvement (Current and /or in the community):  No (Comment)  Discharge Needs  Concerns to be addressed:  Discharge Planning Concerns Readmission within the last 30 days:  No Current discharge risk:  Physical Impairment Barriers to Discharge:  No Barriers Identified   Caesar Mannella A, LCSW 06/11/2015, 2:54 PM [phone]

## 2015-06-12 ENCOUNTER — Ambulatory Visit: Payer: Commercial Managed Care - HMO | Attending: Emergency Medicine | Admitting: Physical Therapy

## 2015-06-12 NOTE — Progress Notes (Signed)
Late Entry This Cm was informed by CSW that pt and son not pleased with SNF options and would like for pt to go home. This CM spoke with pt and son at bedside to offer choice for [facility] and pt son chose [facility]. Son lives with pt and is with her 24hrs a day except when he has dialysis. Pt brother then sits with pt while he is gone. Pt son requesting wheelchair for home use. Orders written. [facility] DME rep contacted for referral for wheelchair. Son also requesting ambulance transport home. No other DC needs identified. Sandford Craze RN,BSN,NCM [phone]

## 2015-06-14 NOTE — Consult Note (Signed)
   [facility] CM Inpatient Consult   06/14/2015  Miriana Gaertner [facility] 01-Jan-1921 [id number]    Spoke with Dr. Jacky Kindle about patient's status at home and high risk for readmission. Patient is made HRI with [facility]. Spoke with Baxter Hire, with [facility] who has been in contact with patient's son Ramon Dredge throughout this time, to make aware of HRI status. [facility] Care Management to follow up.  Raiford Noble, MSN-Ed, RN,BSN [facility] Liaison [phone]

## 2015-06-14 NOTE — Patient Outreach (Signed)
[facility]) Care Management  06/14/2015  Victoria [name] June 12, 1921 [id number]   Referral from Raiford Noble, RN, assigned Emilia Beck, RN and Duanne Moron, PharmD.  Thanks, Corrie Mckusick. Sharlee Blew [facility] Care Management [facility] CM Assistant Phone: [phone] Fax: [phone]

## 2015-06-14 NOTE — Consult Note (Signed)
   [facility] CM Inpatient Consult   06/14/2015  Victoria [name] 14-Jan-1921 [id number]   Received referral from Kristen with [facility] on today 06/14/15 for [facility] Care Management services. Cross checked for eligibility and patient is a [facility] patient. SNF was recommended by son elected to take patient home with home health. However, there is an issue with home health accepting patient at this time. [facility] Care Management referral for medication management and follow up with patient's son, Victoria [name]. Patient has dementia. Requested for patient to be assigned to [facility] RNCM in Community. Patient was discharged on 06/11/15.  Raiford Noble, MSN-Ed, RN,BSN [facility] Liaison [phone]

## 2015-06-15 ENCOUNTER — Other Ambulatory Visit: Payer: Self-pay

## 2015-06-15 ENCOUNTER — Other Ambulatory Visit: Payer: Self-pay | Admitting: Pharmacist

## 2015-06-15 NOTE — Patient Outreach (Signed)
Successful completion of initial telephone contact for community care coordination and week #1 Transition of Care Program HIPPA identifiers satisfied by providing date of birth and address.  Patient's son, Ramon Dredge, provided information as patient deferred to Ramon Dredge to provide information.  The telephone connection was poor, so all parties agreed to home visit next week for further assessment of community care coordination needs.  Plan: Home visit next week

## 2015-06-15 NOTE — Patient Outreach (Signed)
Victoria [name] is a 81 y.o. female referred to pharmacy for medication management following her discharge home from the hospital on 06/11/15. Per referral, called and spoke with patient's son, Victoria [name].  Mr. Tatham reports that he is providing care for the patient in her home. Reports that as we speak, Home Health arrives to meet with the patient. Mr. Menger reports that at discharge patient was taken off of most of her medications, including her blood pressure medications. Reports that she does not yet have a follow up visit scheduled with Victoria [name]. Encouraged him to call and schedule this follow up for [name]. Mr. Gotschall reported that he will do so on Monday. Mr. Forker reports that per the discharge instructions, he has been giving the patient acetaminophen as directed as needed, pravastatin and that he has the Maalox for her, but that she has not needed it. Reports that he is not sure about the cyanocobalamin 1000 MCG that he sees listed on the patient's discharge summary. Let him know that this is Vitamin B12 1000 mcg and that he can get this over the counter at the pharmacy.  Mr. Peasley reports that he and his mother have no further medication questions at this time and do not currently have any need for medication assistance. Provide Mr. Turan with my contact information in case he or his mother have questions in the future.  Duanne Moron, PharmD Clinical Pharmacist [facility] Care Management [phone]

## 2015-06-18 NOTE — Patient Outreach (Signed)
[facility]) Care Management  06/18/2015  Victoria [name] 07/29/1921 [id number]   Call received from [facility], RN/Hospital Liaison for [facility] to inform this RNCM patient had been placed in [facility] for long term placement  Plan: Discharge patient from my caseload

## 2015-06-19 NOTE — Consult Note (Signed)
   [facility] CM Inpatient Consult   06/19/2015  Kourtnee Lahey [facility] 1921-08-23 [id number]   Late entry for 06/18/15. Received call from Dr. Jacky Kindle stating that he received call from [facility] RN Bethann Berkshire) who reports patient needs placement as her son, Ramon Dredge is not able to care for her at home any longer. Telephone call made to patient's son Ramon Dredge to confirm where he wants patient to go. Ramon Dredge reports he would like patient to go to [facility]. Fortunately, patient was faxed out to facilities during her most recent hospitalization. Of note, she was discharged on 06/11/15 from [facility] to home. Calls were made to [employer] to obtain authorization Sander Radon) and to [facility] Toniann Fail, Admission Coordinator) to facilitate transfer to [facility] for 06/18/15 to avoid hospital ED visit. Of note, Ramon Dredge, had planned on sending patient back to ED because he could not take care of her at home. Also noted, Ramon Dredge refused placement to SNF when Ms. Hodgkiss was in the hospital last. Fortunately, patient was in the [facility] program with [facility] that was following patient closely at home with oversight by Dr. Jacky Kindle. Necessary paperwork was completed by Ramon Dredge at [employer]. [facility]AR was called for Ms. Maravilla to be transferred from home to SNF. Ms. Rief was admitted to [facility] for short term per son Edward's request. He states he would like his mother to gain strength and ultimately return home. Will request [facility] Licensed CSW for follow up at SNF as well. Made [facility] Community RNCM aware of the above. Very appreciative of the efforts and collaboration of [facility], [facility], Dr. Jacky Kindle, and [facility] with getting Ms. Belmar the right level of care she needed at the right time.  Raiford Noble, MSN-Ed, RN,BSN [facility] Liaison [phone]

## 2015-06-21 ENCOUNTER — Ambulatory Visit: Payer: Commercial Managed Care - HMO

## 2015-06-21 NOTE — Patient Outreach (Signed)
[facility]) Care Management  06/21/2015  Victoria [name] 10-20-1920 [id number]   Notification from Victoria Beck, RN and Victoria [name], PharmD to close case for [facility] Care Management.  Thanks, Victoria [name]. Victoria [name] [facility] Care Management [facility] CM Assistant Phone: [phone] Fax: [phone]

## 2015-06-22 ENCOUNTER — Other Ambulatory Visit: Payer: Self-pay | Admitting: Licensed Clinical Social Worker

## 2015-06-22 NOTE — Patient Outreach (Addendum)
[facility]) Care [place]  [place] Social Work  06/22/2015  Victoria Victoria May 19, 1921 [id number]    Current Medications:  Current Outpatient Prescriptions  Medication Sig Dispense Refill  . acetaminophen (TYLENOL) 325 MG tablet Take 2 tablets (650 mg total) by mouth every 6 (six) hours as needed for mild pain (or Fever >/= 101).    Marland Kitchen alum & mag hydroxide-simeth (MAALOX/MYLANTA) 200-200-20 MG/5ML suspension Take 30 mLs by mouth every 6 (six) hours as needed for indigestion or heartburn (dyspepsia). 355 mL 0  . pravastatin (PRAVACHOL) 80 MG tablet Take 80 mg by mouth daily.  0  . vitamin B-12 1000 MCG tablet Take 1 tablet (1,000 mcg total) by mouth daily.     No current facility-administered medications for this visit.    Functional Status:  In your present state of health, do you have any difficulty performing the following activities: 06/08/2015  Hearing? N  Vision? N  Difficulty concentrating or making decisions? Y  Walking or climbing stairs? Y  Dressing or bathing? N  Doing errands, shopping? Y    Fall/Depression Screening:  PHQ 2/9 Scores 06/22/2015  PHQ - 2 Score 0    Assessment: CSW [facility] to [facility] to complete SNF visit on 06/22/15. When CSW arrived to room patient was with occupational therapy. CSW waited in lobby until they were finished. CSW contacted patient's adopted son Victoria Victoria and spoke to him for 40 minutes.  HIPPA verifications were received. Victoria Victoria stated patient lives with him and that he dedicates his life to taking care of his mother. Victoria Victoria reports that his mother has "okay support" and shared that family are involved in ways that they can be, another son of patient is blind and needs assistance with driving but comes as he can when spouse is able to transport him and patient receives support from church community and neighbors. Victoria Victoria shares that they go to church together every Sunday and patient receives  socialization through that and when he takes her to the beauty parlor. Patient's son states that she enjoys reading daily and has no issues with her vision. [facility] reports that her dementia has become worse over the years.  [facility] expressed that her weight has not seemed to have changed within the past 3 months. Patient's son communicated that patient was able to dress herself everyday before [facility] admission. [facility] reports that he is satisfied with the care patient is receiving at [facility]. [facility] goes to the SNF daily and has seen progression since her stay there. [facility] discussed with Child psychotherapist at [facility] the desire for patient to stay a few weeks longer at [facility] and it was suggested to him to call his insurance. [facility] informed CSW that insurance stated they would not be able cover this. [facility] reports that patient is not eligible for Medicaid due to her assets. CSW reviewed other options for long term care or care within the home with [facility]. [facility] receives dialysis Monday, Wednesday and Friday for 3 hours and there are concerns for high risk of readmission once she discharges from SNF. CSW reviewed available resources in the community to assist family with care.   CSW returned to patient's room to complete assessment with patient. CSW introduced self, reason for visit and of [facility] Services. Patient was agreeable to services. Patient's affect seemed different than it was during the initial meeting. Patient had very few words to say throughout the session but liked to discuss reading and church. Patient declined to  eat the food that was provided for lunch besides the ice cream and admitted that she has a loss of appetite. Patient shares that she is receiving appropriate care while at [facility] but is eager to go home. Patient reports she has books to read and therefore often stays in her room. Patient denies any feelings of depression or hopelessness. She reports enjoying going to church and CSW  reviewed with her upcoming church related events on the calendar for the next few days. Patient denies being in any pain currently. Patient was agreeable to safe discharge and community resource goals for care plan. After session, CSW was going to speak with social worker at [facility] but they were out on lunch at that time. CSW then called and left a HIPPA compliant voice message for social worker.  Plan: CSW will remain available to patient and family for all needs. CSW will follow case closely and ensure stable transition back home after discharge from SNF.  Marland KitchenDickie Victoria, BSW, MSW, LCSW [facility]. [email or web address] Phone: [phone] Fax: [phone]

## 2015-07-11 ENCOUNTER — Other Ambulatory Visit: Payer: Self-pay | Admitting: Licensed Clinical Social Worker

## 2015-07-11 NOTE — Patient Outreach (Signed)
  [facility] Care Management  [facility] Social Work  07/11/2015  Teofilo Podnnie M Geis [birth year] [id number]   Current Medications:  Current Outpatient Prescriptions  Medication Sig Dispense Refill  . acetaminophen (TYLENOL) 325 MG tablet Take 2 tablets (650 mg total) by mouth every 6 (six) hours as needed for mild pain (or Fever >/= 101).    Marland Kitchen. alum & mag hydroxide-simeth (MAALOX/MYLANTA) 200-200-20 MG/5ML suspension Take 30 mLs by mouth every 6 (six) hours as needed for indigestion or heartburn (dyspepsia). 355 mL 0  . pravastatin (PRAVACHOL) 80 MG tablet Take 80 mg by mouth daily.  0  . vitamin B-12 1000 MCG tablet Take 1 tablet (1,000 mcg total) by mouth daily.     No current facility-administered medications for this visit.    Functional Status:  In your present state of health, do you have any difficulty performing the following activities: 06/08/2015  Hearing? N  Vision? N  Difficulty concentrating or making decisions? Y  Walking or climbing stairs? Y  Dressing or bathing? N  Doing errands, shopping? Y    Fall/Depression Screening:  PHQ 2/9 Scores 06/22/2015  PHQ - 2 Score 0    Assessment: CSW arrived to [facility] to complete visit and was informed that patient had been discharged 07/06/15. CSW contacted patient's son Ramon Dredgedward to inquire on discharge back home. Ramon DredgeEdward provided HIPPA verifications and shared that he is currently at home with his mother now. Ramon Dredgedward states that it was a easy transition back to residence. Ramon Dredgedward shares that patient is receiving Hospice and Palliative care at home 3 days per week with both social workers and nurses. Ramon Dredgedward states "they have helped me so much and I can finally see a change in her. She is even eating better now that she is at home and receiving good help." Ramon Dredgedward communicates that certain medical supplies were ordered for her including a hospital bed that has already arrived and been set up in patient's room. Ramon Dredgedward  reports that patient's support network are visiting her often-patient's son Molly MaduroRobert, patient's church community and family friends. CSW informed Ramon Dredgedward that now Hospice is involved that [facility] will be completing case closure. Ramon Dredgedward receptive to news and feels comfortable with current level of care. CSW informed Ramon Dredgedward to keep number in case he has any questions or concerns.   Plan: CSW will now complete case closure at this time by informing CMA Nena PolioLisa Moore through in basket and will send case closure letter to PCP.  Marland Kitchen.Dickie LaBrooke Delno Blaisdell, BSW, MSW, LCSW [facility] Care Management Bethanie [email or web address] Phone: [phone] Fax: [phone]

## 2015-09-30 DEATH — deceased

## 2015-10-15 IMAGING — CR DG ABDOMEN 2V
4 series · 4 of 4 positions shown · non-contrast
Comparison: 01/21/2009

CLINICAL DATA: Myalgia, weakness for 7 days

EXAM:
ABDOMEN - 2 VIEW

[x abdomen supine (1 of 2)]
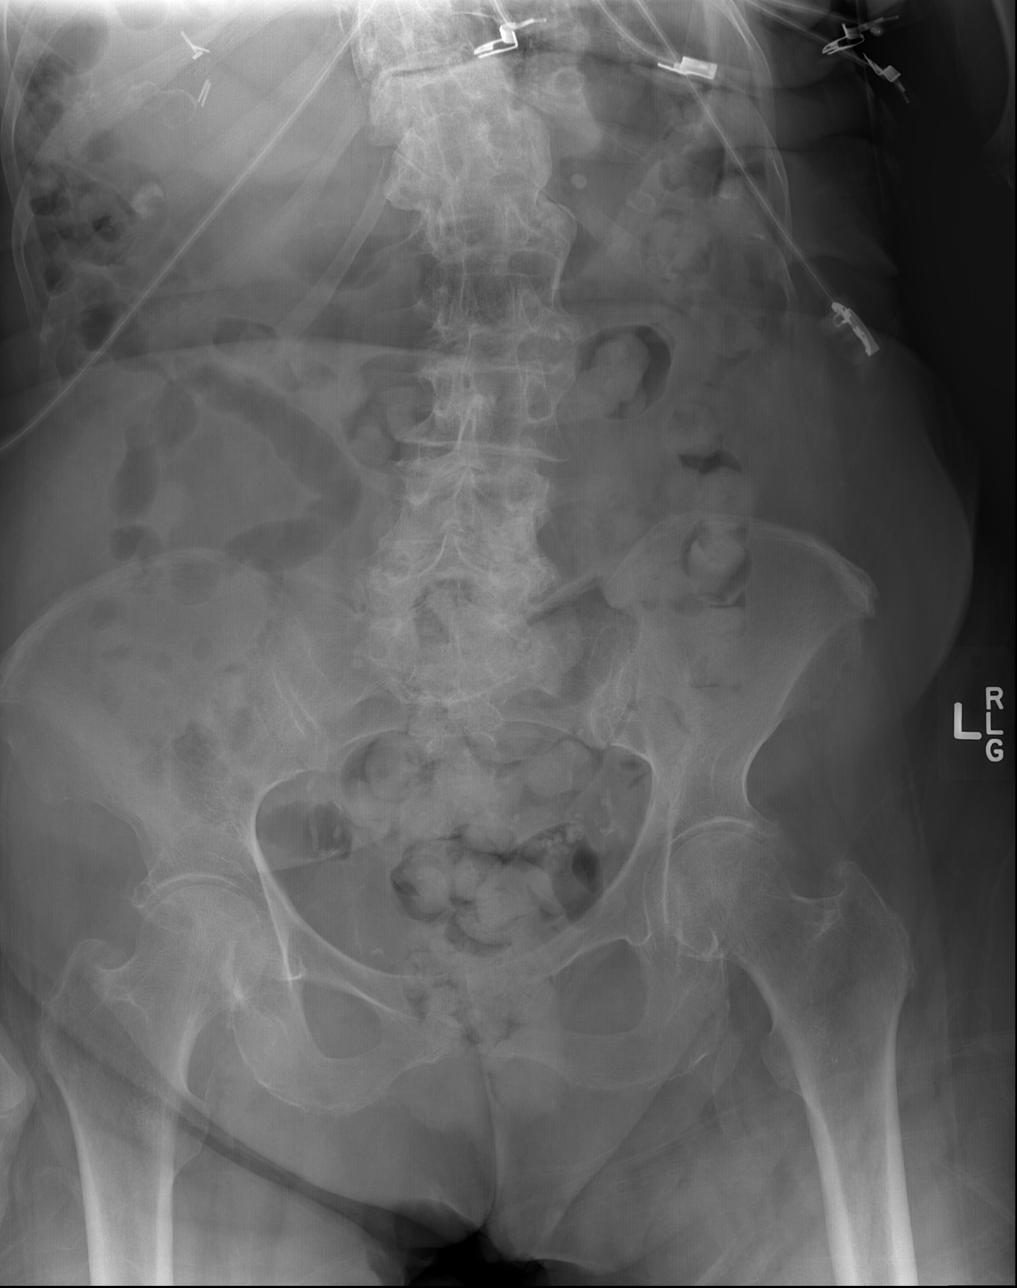

[x abdomen supine (2 of 2)]
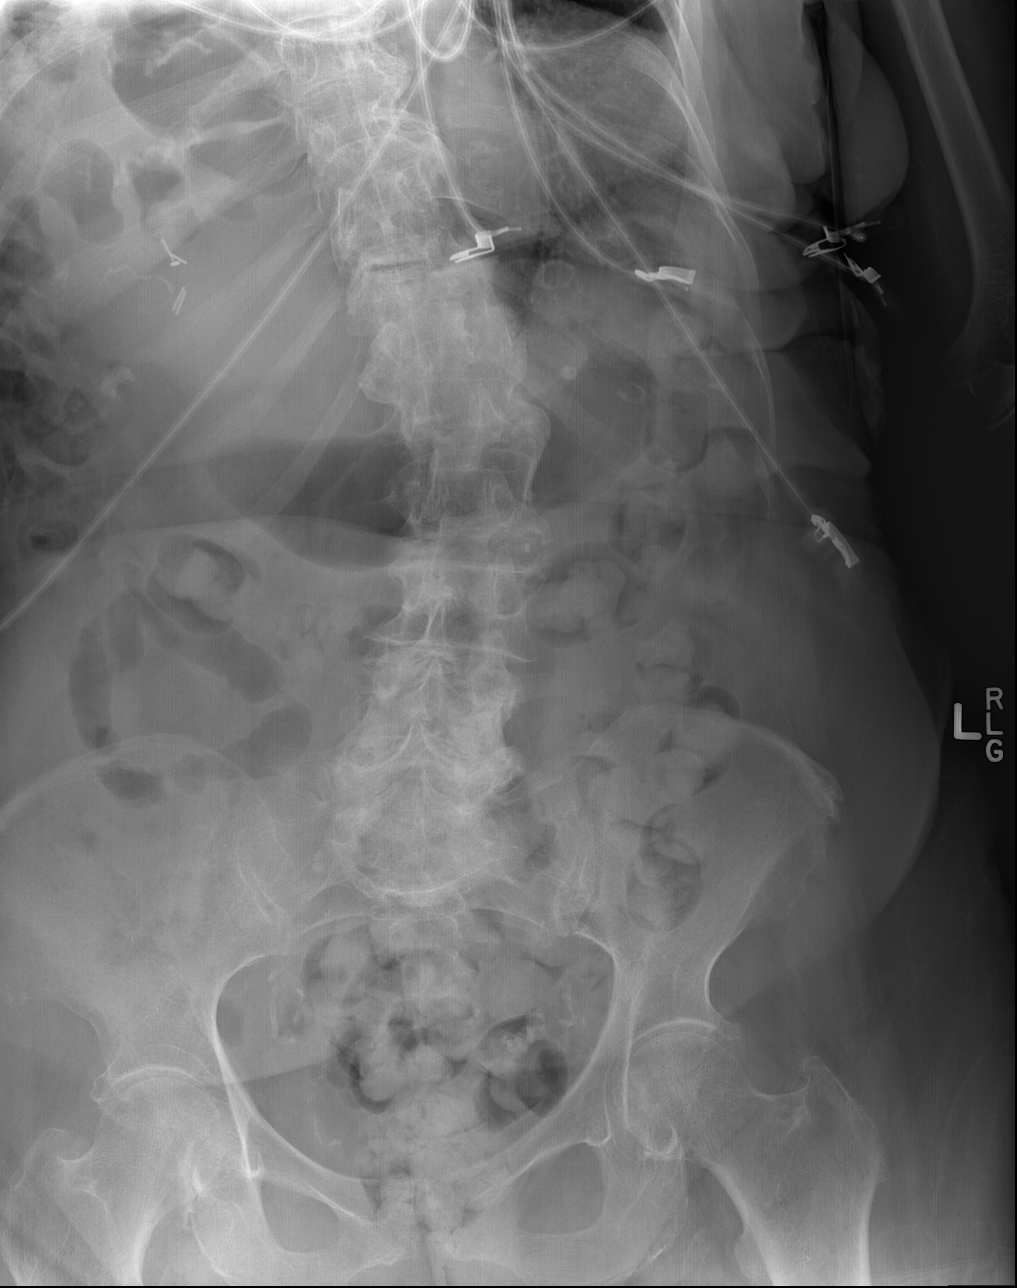

[w abdomen decub (1 of 2)]
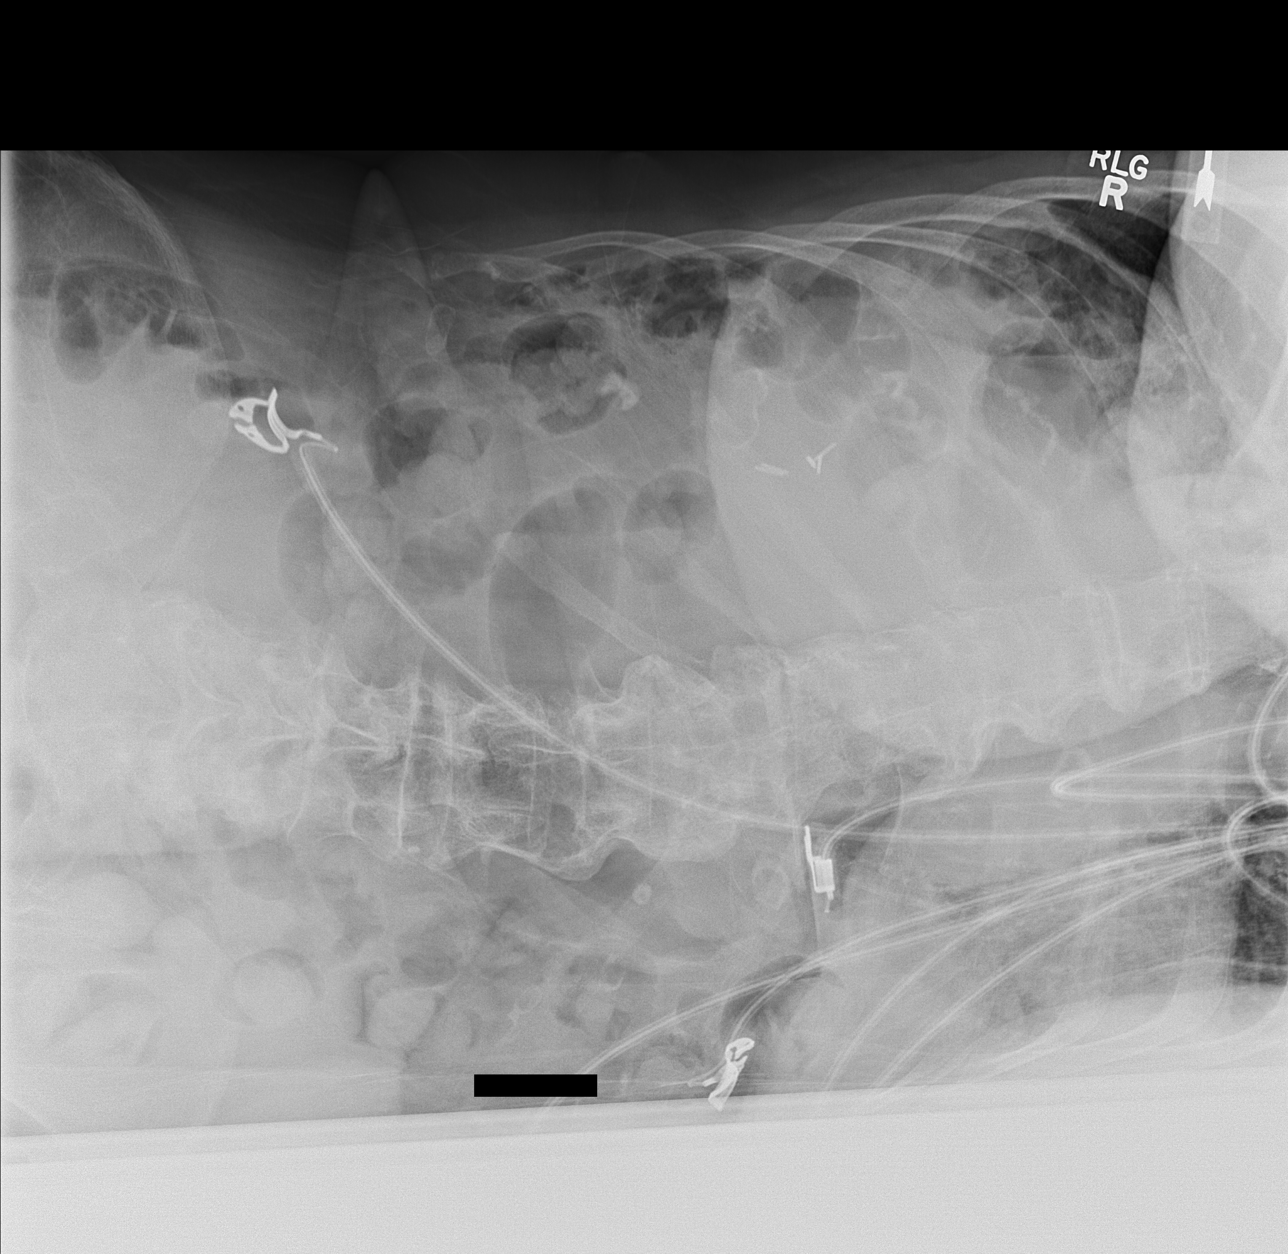

[w abdomen decub (2 of 2)]
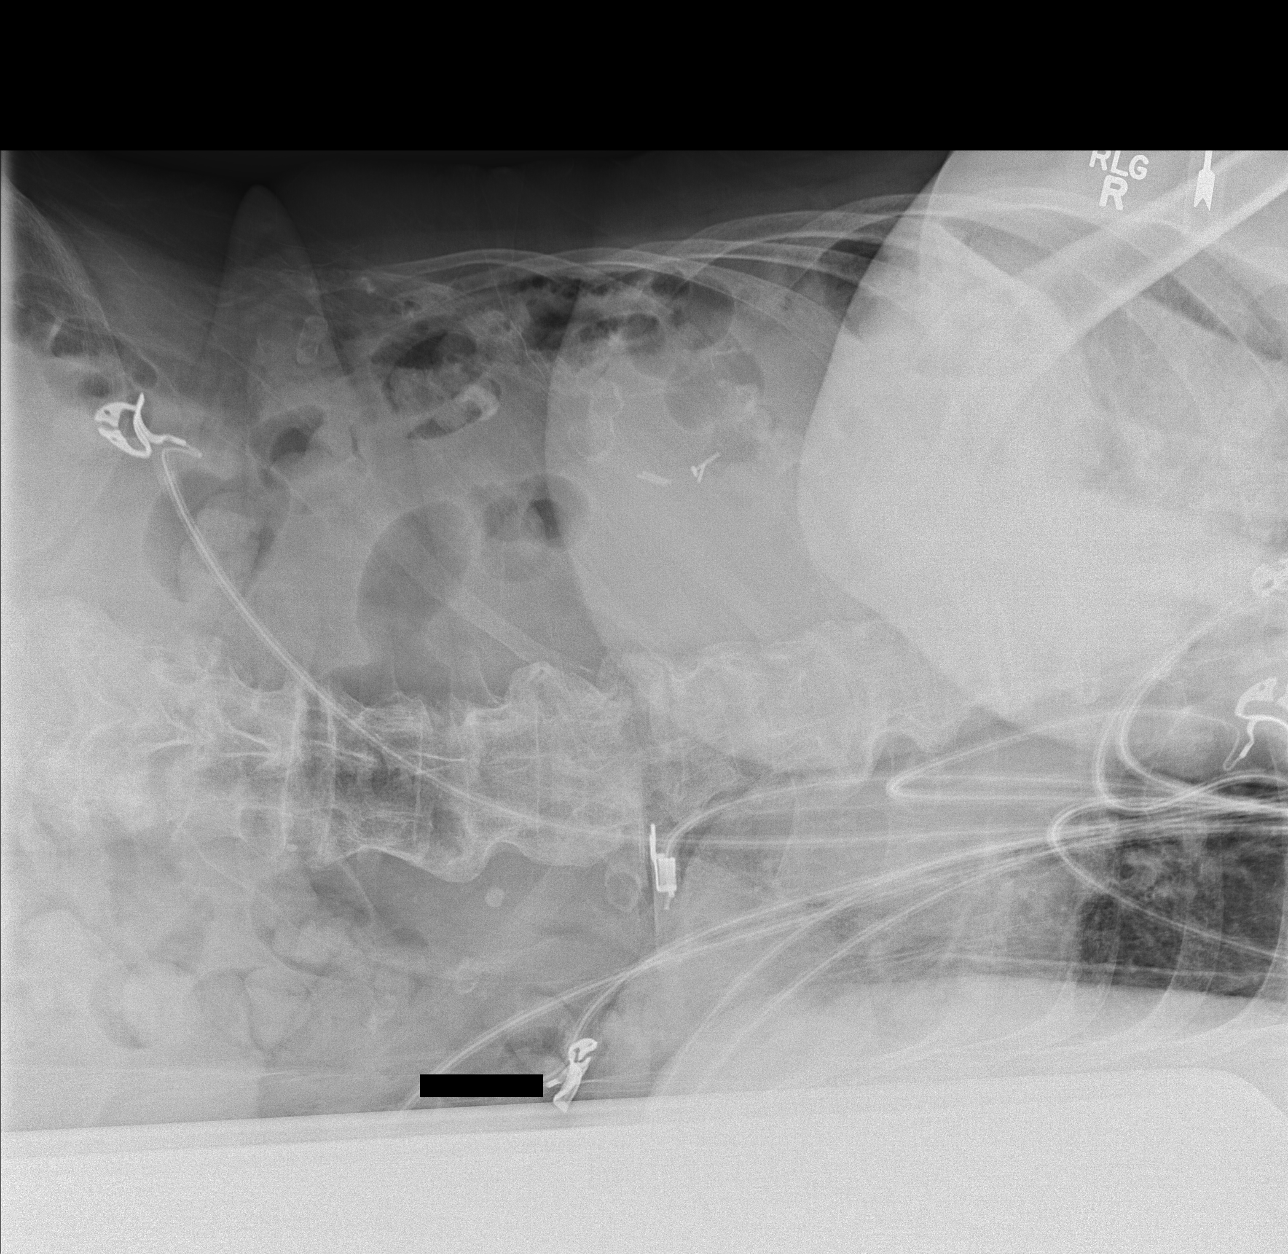

[4 of 4 positions shown; findings below may reference images not displayed]

FINDINGS: Normal bowel gas pattern. Mild stool burden. Right upper quadrant
clips are noted. No air-fluid level or bowel dilatation. Leftward
curvature of the lumbar spine centered at L2 is noted. Presumed
cholecystectomy clips. Vascular calcifications. No free air at
decubitus imaging.
IMPRESSION: Mild stool burden.  Normal bowel gas pattern.
